# Patient Record
Sex: Female | Born: 1985 | Hispanic: Yes | Marital: Single | Smoking: Never smoker
Health system: Southern US, Community
[De-identification: ages and names within clinical notes are randomized; demographics above are authoritative.]

## PROBLEM LIST (undated history)

## (undated) ENCOUNTER — Inpatient Hospital Stay (HOSPITAL_COMMUNITY): Payer: Self-pay

## (undated) DIAGNOSIS — D649 Anemia, unspecified: Secondary | ICD-10-CM

## (undated) DIAGNOSIS — F603 Borderline personality disorder: Secondary | ICD-10-CM

## (undated) DIAGNOSIS — F32A Depression, unspecified: Secondary | ICD-10-CM

## (undated) DIAGNOSIS — F319 Bipolar disorder, unspecified: Secondary | ICD-10-CM

## (undated) DIAGNOSIS — E079 Disorder of thyroid, unspecified: Secondary | ICD-10-CM

## (undated) DIAGNOSIS — F4310 Post-traumatic stress disorder, unspecified: Secondary | ICD-10-CM

## (undated) DIAGNOSIS — M797 Fibromyalgia: Secondary | ICD-10-CM

## (undated) DIAGNOSIS — F419 Anxiety disorder, unspecified: Secondary | ICD-10-CM

## (undated) HISTORY — PX: OTHER SURGICAL HISTORY: SHX169

---

## 2019-07-28 DIAGNOSIS — R7989 Other specified abnormal findings of blood chemistry: Secondary | ICD-10-CM | POA: Insufficient documentation

## 2019-07-28 DIAGNOSIS — F419 Anxiety disorder, unspecified: Secondary | ICD-10-CM | POA: Insufficient documentation

## 2019-07-28 DIAGNOSIS — Z8639 Personal history of other endocrine, nutritional and metabolic disease: Secondary | ICD-10-CM | POA: Insufficient documentation

## 2019-07-28 DIAGNOSIS — D75839 Thrombocytosis, unspecified: Secondary | ICD-10-CM | POA: Insufficient documentation

## 2019-07-28 DIAGNOSIS — F32A Depression, unspecified: Secondary | ICD-10-CM | POA: Insufficient documentation

## 2019-07-28 DIAGNOSIS — R7303 Prediabetes: Secondary | ICD-10-CM | POA: Insufficient documentation

## 2019-07-28 DIAGNOSIS — D649 Anemia, unspecified: Secondary | ICD-10-CM | POA: Insufficient documentation

## 2019-07-28 DIAGNOSIS — R251 Tremor, unspecified: Secondary | ICD-10-CM | POA: Insufficient documentation

## 2019-07-28 DIAGNOSIS — M797 Fibromyalgia: Secondary | ICD-10-CM | POA: Insufficient documentation

## 2019-08-30 DIAGNOSIS — R29898 Other symptoms and signs involving the musculoskeletal system: Secondary | ICD-10-CM | POA: Insufficient documentation

## 2019-08-30 DIAGNOSIS — M5136 Other intervertebral disc degeneration, lumbar region: Secondary | ICD-10-CM | POA: Insufficient documentation

## 2019-08-30 DIAGNOSIS — M79604 Pain in right leg: Secondary | ICD-10-CM | POA: Insufficient documentation

## 2019-08-30 DIAGNOSIS — M545 Low back pain, unspecified: Secondary | ICD-10-CM | POA: Insufficient documentation

## 2020-01-12 DIAGNOSIS — U071 COVID-19: Secondary | ICD-10-CM

## 2020-01-12 HISTORY — DX: COVID-19: U07.1

## 2020-03-26 ENCOUNTER — Emergency Department (INDEPENDENT_AMBULATORY_CARE_PROVIDER_SITE_OTHER): Payer: BLUE CROSS/BLUE SHIELD

## 2020-03-26 ENCOUNTER — Other Ambulatory Visit: Payer: Self-pay

## 2020-03-26 ENCOUNTER — Emergency Department (INDEPENDENT_AMBULATORY_CARE_PROVIDER_SITE_OTHER)
Admission: EM | Admit: 2020-03-26 | Discharge: 2020-03-26 | Disposition: A | Discharge status: Home / Self Care | Payer: Self-pay | Source: Home / Self Care | Attending: Family Medicine | Admitting: Family Medicine

## 2020-03-26 DIAGNOSIS — R2241 Localized swelling, mass and lump, right lower limb: Secondary | ICD-10-CM | POA: Diagnosis not present

## 2020-03-26 DIAGNOSIS — T1490XA Injury, unspecified, initial encounter: Secondary | ICD-10-CM

## 2020-03-26 DIAGNOSIS — M25571 Pain in right ankle and joints of right foot: Secondary | ICD-10-CM | POA: Diagnosis not present

## 2020-03-26 DIAGNOSIS — M25561 Pain in right knee: Secondary | ICD-10-CM

## 2020-03-26 DIAGNOSIS — S8001XA Contusion of right knee, initial encounter: Secondary | ICD-10-CM

## 2020-03-26 DIAGNOSIS — S9001XA Contusion of right ankle, initial encounter: Secondary | ICD-10-CM

## 2020-03-26 MED ORDER — DICLOFENAC EPOLAMINE 1.3 % EX PTCH: 1.0000 | MEDICATED_PATCH | Freq: Two times a day (BID) | CUTANEOUS | 0 refills | Status: DC

## 2020-03-26 NOTE — ED Provider Notes (Signed)
[facility]    CSN: [id number] Arrival date & time: 03/26/20  1300      History   Chief Complaint Chief Complaint  Patient presents with  . Ankle Pain    HPI Amanda Parsons is a 35 y.o. female.   HPI   States that while at work yesterday she went to the ladies room.  Leaned on the rim of the sink and it came loose from the wall and fell to the floor.  On its way down to her thigh and ankle right leg.  These are painful and bruised.  Pain with weightbearing.  Some swelling.  She is here for evaluation.  She states that she feels that this was a hazard that was known to her employer and has already contacted an attorney  History reviewed. No pertinent past medical history.  There are no problems to display for this patient.   Past Surgical History:  Procedure Laterality Date  . sinus cavity surgery      OB History   No obstetric history on file.      Home Medications    Prior to Admission medications   Medication Sig Start Date End Date Taking? Authorizing Provider  albuterol (VENTOLIN HFA) 108 (90 Base) MCG/ACT inhaler Inhale into the lungs. 01/14/20  Yes [provider]  cetirizine (ZYRTEC) 10 MG tablet Take by mouth.   Yes [provider]  diclofenac (FLECTOR) 1.3 % PT[facility] Place 1 patch onto the skin 2 (two) times daily. 03/26/20  Yes Eustace Moore, MD  hydrOXYzine (ATARAX/VISTARIL) 25 MG tablet Take by mouth. 11/02/19  Yes [provider]  lamoTRIgine (LAMICTAL) 25 MG tablet Take by mouth. 03/24/20  Yes [provider]    Family History Family History  Problem Relation Age of Onset  . Diabetes Mother   . Hashimoto's thyroiditis Mother   . Diabetes Other     Social History Social History   Tobacco Use  . Smoking status: Never Smoker  . Smokeless tobacco: Never Used  Substance Use Topics  . Drug use: Never     Allergies   Cat hair extract, Citrullus vulgaris, Gluten meal, Ibuprofen, Latex, Nsaids,  Pineapple, Plum pulp, Pork-derived products, Salvia hispanica, Amoxicillin, Duloxetine, Levonorgestrel-ethinyl estrad, Metoclopramide, Pregabalin, and Tramadol   Review of Systems Review of Systems See HPI  Physical Exam Triage Vital Signs ED Triage Vitals  Enc Vitals Group     BP 03/26/20 1400 118/78     Pulse Rate 03/26/20 1400 (!) 101     Resp --      Temp 03/26/20 1400 98.5 F (36.9 C)     Temp src --      SpO2 03/26/20 1400 97 %     Weight 03/26/20 1350 155 lb (70.3 kg)     Height 03/26/20 1350 5\' 4"  (1.626 m)     Head Circumference --      Peak Flow --      Pain Score 03/26/20 1601 0     Pain Loc --      Pain Edu? --      Excl. in GC? --    No data found.  Updated Vital Signs BP 118/78 (BP Location: Right Arm)   Pulse (!) 101   Temp 98.5 F (36.9 C)   Ht 5\' 4"  (1.626 m)   Wt 70.3 kg   LMP 02/29/2020   SpO2 97%   BMI 26.61 kg/m     Physical Exam Constitutional:  General: She is not in acute distress.    Appearance: She is well-developed and well-nourished.     Comments: Pleasant and cooperative.  Appears uncomfortable  HENT:     Head: Normocephalic and atraumatic.     Mouth/Throat:     Mouth: Oropharynx is clear and moist.     Comments: Mask is in place Eyes:     Conjunctiva/sclera: Conjunctivae normal.     Pupils: Pupils are equal, round, and reactive to light.  Cardiovascular:     Rate and Rhythm: Normal rate.  Pulmonary:     Effort: Pulmonary effort is normal. No respiratory distress.  Abdominal:     General: There is no distension.     Palpations: Abdomen is soft.  Musculoskeletal:        General: No edema. Normal range of motion.     Cervical back: Normal range of motion.       Legs:  Skin:    General: Skin is warm and dry.  Neurological:     Mental Status: She is alert.     Gait: Gait abnormal.     Comments: Mild antalgic gait      UC Treatments / Results  Labs (all labs ordered are listed, but only abnormal results are  displayed) Labs Reviewed - No data to display  EKG   Radiology DG Ankle Complete Right  Result Date: 03/26/2020 CLINICAL DATA:  Pain EXAM: RIGHT ANKLE - COMPLETE 3+ VIEW COMPARISON:  None. FINDINGS: There are soft tissue swelling about the medial aspect of the ankle and foot. There is no definite acute displaced fracture or dislocation. There appears to be a well corticated osseous fragment adjacent to the medial calcaneus, perhaps related to an old remote injury. IMPRESSION: Soft tissue swelling without evidence for an acute displaced fracture. Electronically Signed   By: Katherine Mantle M.D.   On: 03/26/2020 15:25   DG Knee AP/LAT W/Sunrise Right  Result Date: 03/26/2020 CLINICAL DATA:  Pain EXAM: RIGHT KNEE 3 VIEWS COMPARISON:  None. FINDINGS: No evidence of fracture, dislocation, or joint effusion. No evidence of arthropathy or other focal bone abnormality. Soft tissues are unremarkable. IMPRESSION: Negative. Electronically Signed   By: Katherine Mantle M.D.   On: 03/26/2020 15:23    Procedures Procedures (including critical care time)  Medications Ordered in UC Medications - No data to display  Initial Impression / Assessment and Plan / UC Course  I have reviewed the triage vital signs and the nursing notes.  Pertinent labs & imaging results that were available during my care of the patient were reviewed by me and considered in my medical decision making (see chart for details).     Conservative management.Follow-up Worker's Comp. provider Final Clinical Impressions(s) / UC Diagnoses   Final diagnoses:  Contusion of right knee, initial encounter  Contusion of right ankle, initial encounter     Discharge Instructions     Contusion right knee Contusion right ankle Limit walking while  leg is painful Use ice for 20 minutes every couple of hours With Worker's Comp. clinic    ED Prescriptions    Medication Sig Dispense Auth. Provider   diclofenac (FLECTOR) 1.3 %  PT[facility] Place 1 patch onto the skin 2 (two) times daily. 60 patch Eustace Moore, MD     PDMP not reviewed this encounter.   Eustace Moore, MD 03/26/20 [id number]

## 2020-03-26 NOTE — ED Triage Notes (Signed)
Pt states that she injured her right leg.x1 day

## 2020-03-26 NOTE — Discharge Instructions (Addendum)
Contusion right knee Contusion right ankle Limit walking while  leg is painful Use ice for 20 minutes every couple of hours With [employer]. clinic

## 2020-03-27 ENCOUNTER — Telehealth: Payer: Self-pay | Admitting: Emergency Medicine

## 2020-03-27 NOTE — Telephone Encounter (Signed)
Call to pharmacy regarding alternative med request - diclofenac epolamine 1.3% patch. Per pharmacist, insurance will not cover med because it can be purchased over the counter and there is not an alternative.

## 2020-03-27 NOTE — Telephone Encounter (Signed)
Call back to patient regarding prescription. Pt has already spoken with the insurance company regarding the medication and is aware that it is not covered and there are no alternatives. Pt states she is unable to afford the medicine over the counter due to limited funds already for food and gas.Patient apologized for her short tone on the phone.  RN stated she understood the patient's frustration with not being able to obtain the medicine and did not notice anything negative in the patient's tone. RN wished patient well.

## 2020-03-29 NOTE — Telephone Encounter (Signed)
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

## 2020-04-15 ENCOUNTER — Other Ambulatory Visit: Payer: Self-pay

## 2020-04-15 ENCOUNTER — Emergency Department (INDEPENDENT_AMBULATORY_CARE_PROVIDER_SITE_OTHER)
Admission: RE | Admit: 2020-04-15 | Discharge: 2020-04-15 | Disposition: A | Discharge status: Home / Self Care | Payer: BLUE CROSS/BLUE SHIELD | Source: Ambulatory Visit

## 2020-04-15 VITALS — BP 113/80 | HR 88 | Temp 99.5°F | Resp 17

## 2020-04-15 DIAGNOSIS — R5383 Other fatigue: Secondary | ICD-10-CM

## 2020-04-15 DIAGNOSIS — N939 Abnormal uterine and vaginal bleeding, unspecified: Secondary | ICD-10-CM | POA: Diagnosis not present

## 2020-04-15 DIAGNOSIS — R55 Syncope and collapse: Secondary | ICD-10-CM

## 2020-04-15 DIAGNOSIS — R1030 Lower abdominal pain, unspecified: Secondary | ICD-10-CM

## 2020-04-15 LAB — POCT URINALYSIS DIP (MANUAL ENTRY)
Bilirubin, UA: NEGATIVE
Glucose, UA: NEGATIVE mg/dL
Ketones, POC UA: NEGATIVE mg/dL
Leukocytes, UA: NEGATIVE
Nitrite, UA: NEGATIVE
Spec Grav, UA: 1.025 (ref 1.010–1.025)
Urobilinogen, UA: 0.2 E.U./dL
pH, UA: 7 (ref 5.0–8.0)

## 2020-04-15 LAB — POCT URINE PREGNANCY: Preg Test, Ur: NEGATIVE

## 2020-04-15 NOTE — ED Triage Notes (Signed)
Abdominal cramping & bleeding after fainting 2 days ago  Home pregnancy test negative 1 week ago  COVID in November  UTI in Jan 2022 No COVID vaccine

## 2020-04-15 NOTE — ED Provider Notes (Signed)
[facility]    CSN: [id number] Arrival date & time: 04/15/20  1445      History   Chief Complaint Chief Complaint  Patient presents with  . Abdominal Cramping  . Vaginal Bleeding    HPI Amanda Parsons is a 35 y.o. female.   Reports that she has been having abnormal abdominal cramping and vaginal bleeding than usual intermittently for the last 2 weeks. Reports increased fatigue and abdominal pain. Reports negative urine pregnancy test at home last week. Reports that she also passed out x 2 days ago after an episode of panic and increased stress. She has history of anxiety, depression, anemia, elevated LFTs, thyroid disorder. Reports hx of borderline personality disorder as well. Denies headache, nausea, vomiting, diarrhea, changes in appetite, rash, fever, other symptoms.  ROS per HPI  The history is provided by the patient.    History reviewed. No pertinent past medical history.  Patient Active Problem List   Diagnosis Date Noted  . DDD (degenerative disc disease), lumbar 08/30/2019  . Leg pain, bilateral 08/30/2019  . Leg weakness 08/30/2019  . Low back pain 08/30/2019  . Anxiety and depression 07/28/2019  . Anemia 07/28/2019  . Elevated LFTs 07/28/2019  . Fibromyalgia 07/28/2019  . History of thyroid disorder 07/28/2019  . Prediabetes 07/28/2019  . Tremors of nervous system 07/28/2019    Past Surgical History:  Procedure Laterality Date  . sinus cavity surgery      OB History   No obstetric history on file.      Home Medications    Prior to Admission medications   Medication Sig Start Date End Date Taking? Authorizing Provider  cetirizine (ZYRTEC) 10 MG tablet Take by mouth.   Yes [provider]  hydrOXYzine (ATARAX/VISTARIL) 25 MG tablet Take by mouth. 11/02/19  Yes [provider]  lamoTRIgine (LAMICTAL) 25 MG tablet Take by mouth. 03/24/20  Yes [provider]  omeprazole (PRILOSEC) 20 MG capsule Take 1 capsule  by mouth daily. 03/27/20  Yes [provider]  albuterol (VENTOLIN HFA) 108 (90 Base) MCG/ACT inhaler Inhale into the lungs. 01/14/20   [provider]  diclofenac (FLECTOR) 1.3 % PT[facility] Place 1 patch onto the skin 2 (two) times daily. Patient not taking: Reported on 04/15/2020 03/26/20   Eustace Moore, MD    Family History Family History  Problem Relation Age of Onset  . Diabetes Mother   . Hashimoto's thyroiditis Mother   . Diabetes Other     Social History Social History   Tobacco Use  . Smoking status: Never Smoker  . Smokeless tobacco: Never Used  Substance Use Topics  . Alcohol use: Not Currently  . Drug use: Never     Allergies   Cat hair extract, Citrullus vulgaris, Gluten meal, Ibuprofen, Latex, Nsaids, Pineapple, Plum pulp, Pork-derived products, Salvia hispanica, Amoxicillin, Duloxetine, Levonorgestrel-ethinyl estrad, Metoclopramide, Pregabalin, and Tramadol   Review of Systems Review of Systems   Physical Exam Triage Vital Signs ED Triage Vitals  Enc Vitals Group     BP 04/15/20 1509 113/80     Pulse Rate 04/15/20 1509 88     Resp 04/15/20 1509 17     Temp 04/15/20 1509 99.5 F (37.5 C)     Temp Source 04/15/20 1509 Oral     SpO2 04/15/20 1509 98 %     Weight --      Height --      Head Circumference --      Peak Flow --  Pain Score 04/15/20 1510 4     Pain Loc --      Pain Edu? --      Excl. in GC? --    No data found.  Updated Vital Signs BP 113/80 (BP Location: Right Arm)   Pulse 88   Temp 99.5 F (37.5 C) (Oral)   Resp 17   LMP 03/29/2020 (Exact Date)   SpO2 98%   Visual Acuity Right Eye Distance:   Left Eye Distance:   Bilateral Distance:    Right Eye Near:   Left Eye Near:    Bilateral Near:     Physical Exam Vitals and nursing note reviewed.  Constitutional:      General: She is not in acute distress.    Appearance: Normal appearance. She is well-developed and well-nourished. She is not ill-appearing.   HENT:     Head: Normocephalic and atraumatic.  Eyes:     Conjunctiva/sclera: Conjunctivae normal.  Cardiovascular:     Rate and Rhythm: Normal rate and regular rhythm.     Heart sounds: Normal heart sounds. No murmur heard.   Pulmonary:     Effort: Pulmonary effort is normal. No respiratory distress.     Breath sounds: Normal breath sounds. No stridor. No wheezing, rhonchi or rales.  Chest:     Chest wall: No tenderness.  Abdominal:     General: Bowel sounds are normal. There is no distension.     Palpations: Abdomen is soft. There is no mass.     Tenderness: There is no abdominal tenderness. There is no right CVA tenderness, left CVA tenderness, guarding or rebound.     Hernia: No hernia is present.  Musculoskeletal:        General: No edema. Normal range of motion.     Cervical back: Normal range of motion and neck supple.  Skin:    General: Skin is warm and dry.     Capillary Refill: Capillary refill takes less than 2 seconds.  Neurological:     General: No focal deficit present.     Mental Status: She is alert and oriented to person, place, and time.  Psychiatric:        Mood and Affect: Mood and affect and mood normal.        Behavior: Behavior normal.        Thought Content: Thought content normal.      UC Treatments / Results  Labs (all labs ordered are listed, but only abnormal results are displayed) Labs Reviewed  POCT URINALYSIS DIP (MANUAL ENTRY) - Abnormal; Notable for the following components:      Result Value   Blood, UA large (*)    Protein Ur, POC trace (*)    All other components within normal limits  URINE CULTURE  BASIC METABOLIC PANEL  CBC WITH DIFFERENTIAL/PLATELET  HCG, QUANTITATIVE, PREGNANCY  POCT URINE PREGNANCY    EKG   Radiology No results found.  Procedures Procedures (including critical care time)  Medications Ordered in UC Medications - No data to display  Initial Impression / Assessment and Plan / UC Course  I have  reviewed the triage vital signs and the nursing notes.  Pertinent labs & imaging results that were available during my care of the patient were reviewed by me and considered in my medical decision making (see chart for details).    Fatigue Low Abdominal Pain Abnormal Vaginal Bleeding Syncope  UA unremarkable for infection Urine pregnancy test is negative in office today Will  check CBC, BMP, beta HCG Discussed supplementing iron given hx anemia, abnormal bleeding and fatigue Will follow up with abnormal results that require further treatment Follow up with this office or with primary care if symptoms are persisting.  Follow up in the ER for high fever, trouble swallowing, trouble breathing, other concerning symptoms.  Final Clinical Impressions(s) / UC Diagnoses   Final diagnoses:  Other fatigue  Lower abdominal pain  Abnormal vaginal bleeding  Syncope, unspecified syncope type     Discharge Instructions     Continue current medication regimen at home  Urine looks good today. We will culture to be sure that there is no infection and inform of positive results that require further treatment  Blood work is pending. We will inform you of any abnormal results that require further treatment  Push fluids and get plenty of rest  Follow up with this office or with primary care if symptoms are persisting.  Follow up in the ER for high fever, trouble swallowing, trouble breathing, other concerning symptoms.     ED Prescriptions    None     PDMP not reviewed this encounter.   Moshe Cipro, NP 04/16/20 [id number]

## 2020-04-15 NOTE — Discharge Instructions (Signed)
Continue current medication regimen at home  Urine looks good today. We will culture to be sure that there is no infection and inform of positive results that require further treatment  Blood work is pending. We will inform you of any abnormal results that require further treatment  Push fluids and get plenty of rest  Follow up with this office or with primary care if symptoms are persisting.  Follow up in the ER for high fever, trouble swallowing, trouble breathing, other concerning symptoms.

## 2020-04-16 LAB — BASIC METABOLIC PANEL
BUN: 15 mg/dL (ref 7–25)
CO2: 25 mmol/L (ref 20–32)
Calcium: 9.3 mg/dL (ref 8.6–10.2)
Chloride: 107 mmol/L (ref 98–110)
Creat: 0.57 mg/dL (ref 0.50–1.10)
Glucose, Bld: 97 mg/dL (ref 65–99)
Potassium: 4.1 mmol/L (ref 3.5–5.3)
Sodium: 139 mmol/L (ref 135–146)

## 2020-04-16 LAB — URINE CULTURE
MICRO NUMBER:: 11613581
SPECIMEN QUALITY:: ADEQUATE

## 2020-04-16 LAB — CBC WITH DIFFERENTIAL/PLATELET
Absolute Monocytes: 572 cells/uL (ref 200–950)
Basophils Absolute: 62 cells/uL (ref 0–200)
Basophils Relative: 0.7 %
Eosinophils Absolute: 88 cells/uL (ref 15–500)
Eosinophils Relative: 1 %
HCT: 40.3 % (ref 35.0–45.0)
Hemoglobin: 13.2 g/dL (ref 11.7–15.5)
Lymphs Abs: 1558 cells/uL (ref 850–3900)
MCH: 27.4 pg (ref 27.0–33.0)
MCHC: 32.8 g/dL (ref 32.0–36.0)
MCV: 83.8 fL (ref 80.0–100.0)
MPV: 10.3 fL (ref 7.5–12.5)
Monocytes Relative: 6.5 %
Neutro Abs: 6521 cells/uL (ref 1500–7800)
Neutrophils Relative %: 74.1 %
Platelets: 473 10*3/uL — ABNORMAL HIGH (ref 140–400)
RBC: 4.81 10*6/uL (ref 3.80–5.10)
RDW: 13 % (ref 11.0–15.0)
Total Lymphocyte: 17.7 %
WBC: 8.8 10*3/uL (ref 3.8–10.8)

## 2020-04-16 LAB — HCG, QUANTITATIVE, PREGNANCY: HCG, Total, QN: <3 m[IU]/mL

## 2020-04-27 ENCOUNTER — Ambulatory Visit: Payer: Self-pay

## 2020-04-27 ENCOUNTER — Other Ambulatory Visit: Payer: Self-pay

## 2020-04-27 ENCOUNTER — Emergency Department
Admission: RE | Admit: 2020-04-27 | Discharge: 2020-04-27 | Disposition: A | Discharge status: Home / Self Care | Payer: BLUE CROSS/BLUE SHIELD | Source: Ambulatory Visit | Attending: Family Medicine | Admitting: Family Medicine

## 2020-04-27 VITALS — BP 118/83 | HR 92 | Temp 98.4°F | Resp 18

## 2020-04-27 DIAGNOSIS — R7989 Other specified abnormal findings of blood chemistry: Secondary | ICD-10-CM

## 2020-04-27 DIAGNOSIS — F603 Borderline personality disorder: Secondary | ICD-10-CM | POA: Insufficient documentation

## 2020-04-27 NOTE — Discharge Instructions (Signed)
Your liver enzymes and pancreatitis tests have not been repeated since January Last time you had diarrhea you did have pancreatitis I have repeated these blood test today Continue your bland diet.  Drink plenty of fluids Consider Imodium AD, 1 tablet daily for loose bowels and diarrhea See your primary care doctor next week

## 2020-04-27 NOTE — ED Triage Notes (Signed)
Abdominal cramping w/ diarrhea x 2 days  chronic fatigue  Limited PO intake

## 2020-04-27 NOTE — ED Notes (Signed)
Labs not drawn due to poor IV access- pt does not want to return in am for labs- has a PCP appointment on 05/02/20 & will have labs drawn at that time. Dr Delton See verbally updated by RN

## 2020-04-27 NOTE — ED Provider Notes (Signed)
[facility]    CSN: [id number] Arrival date & time: 04/27/20  1857      History   Chief Complaint Chief Complaint  Patient presents with  . Diarrhea    HPI Amanda Parsons is a 35 y.o. female.   HPI  Patient has a long history of abdominal problems.  Multiple food allergies.  She takes her a probiotic daily.  She was seen in the emergency room for diarrhea in January of this year.  Her lipase was 145, she was having diarrhea, she was told she might have some pancreatitis.  Also has a history of increased liver function tests.  She has a primary care doctor who takes care of her. Patient just finished a course of Keflex for UTI.  States has had recurring UTIs.  This is one of the issues to discuss with her PCP.  I recommended that she repeat a UA today to make sure her  UTI has resolved. Developed "diarrhea" after she finished the antibiotic for her UTI.  She states she is having 1 or 2 bowel movements a day, but they are looser.  1 for bowel movements is fatty.  She thinks that her pancreatitis is coming back.  She does not drink alcohol.  She is also having some crampy abdominal pain.  History reviewed. No pertinent past medical history.  Patient Active Problem List   Diagnosis Date Noted  . Borderline personality disorder (HCC) 04/27/2020  . DDD (degenerative disc disease), lumbar 08/30/2019  . Anxiety and depression 07/28/2019  . Anemia 07/28/2019  . Elevated LFTs 07/28/2019  . Fibromyalgia 07/28/2019  . History of thyroid disorder 07/28/2019  . Prediabetes 07/28/2019  . Tremors of nervous system 07/28/2019    Past Surgical History:  Procedure Laterality Date  . sinus cavity surgery      OB History   No obstetric history on file.      Home Medications    Prior to Admission medications   Medication Sig Start Date End Date Taking? Authorizing Provider  cetirizine (ZYRTEC) 10 MG tablet Take by mouth.   Yes [provider]  hydrOXYzine  (ATARAX/VISTARIL) 25 MG tablet Take by mouth. 11/02/19  Yes [provider]  lamoTRIgine (LAMICTAL) 25 MG tablet Take by mouth. 03/24/20  Yes [provider]  omeprazole (PRILOSEC) 20 MG capsule Take 1 capsule by mouth daily. 03/27/20  Yes [provider]  Probiotic Product (MISC INTESTINAL FLORA REGULAT) CAPS Take 1 tablet by mouth daily.   Yes [provider]  Turmeric 500 MG CAPS Take by mouth.   Yes [provider]  albuterol (VENTOLIN HFA) 108 (90 Base) MCG/ACT inhaler Inhale into the lungs. 01/14/20   [provider]    Family History Family History  Problem Relation Age of Onset  . Diabetes Mother   . Hashimoto's thyroiditis Mother   . Diabetes Other     Social History Social History   Tobacco Use  . Smoking status: Never Smoker  . Smokeless tobacco: Never Used  Substance Use Topics  . Alcohol use: Not Currently  . Drug use: Never     Allergies   Citrullus vulgaris, Gluten meal, Ibuprofen, Latex, Nsaids, Pineapple, Plum pulp, Pork-derived products, Salvia hispanica, Amoxicillin, Cat hair extract, Duloxetine, Levonorgestrel-ethinyl estrad, Metoclopramide, Pregabalin, and Tramadol   Review of Systems Review of Systems See HPI  Physical Exam Triage Vital Signs ED Triage Vitals  Enc Vitals Group     BP 04/27/20 1921 118/83     Pulse  Rate 04/27/20 1921 92     Resp 04/27/20 1921 18     Temp 04/27/20 1921 98.4 F (36.9 C)     Temp Source 04/27/20 1921 Oral     SpO2 04/27/20 1921 100 %     Weight --      Height --      Head Circumference --      Peak Flow --      Pain Score 04/27/20 1945 2     Pain Loc --      Pain Edu? --      Excl. in GC? --    No data found.  Updated Vital Signs BP 118/83 (BP Location: Right Arm)   Pulse 92   Temp 98.4 F (36.9 C) (Oral)   Resp 18   LMP 04/14/2020 (Exact Date)   SpO2 100%      Physical Exam Constitutional:      General: She is not in acute distress.     Appearance: She is well-developed.  HENT:     Head: Normocephalic and atraumatic.     Right Ear: Tympanic membrane, ear canal and external ear normal.     Left Ear: Tympanic membrane, ear canal and external ear normal.     Nose: Nose normal.     Mouth/Throat:     Mouth: Mucous membranes are moist.     Pharynx: No posterior oropharyngeal erythema.  Eyes:     Conjunctiva/sclera: Conjunctivae normal.     Pupils: Pupils are equal, round, and reactive to light.  Cardiovascular:     Rate and Rhythm: Normal rate and regular rhythm.     Heart sounds: Normal heart sounds.  Pulmonary:     Effort: Pulmonary effort is normal. No respiratory distress.  Chest:     Chest wall: No tenderness.  Abdominal:     General: There is no distension.     Palpations: Abdomen is soft.     Tenderness: There is abdominal tenderness. There is no guarding or rebound.     Comments: Moderate tenderness in right lower quadrant without guarding or rebound.  No hepatosplenomegaly.  Musculoskeletal:        General: Normal range of motion.     Cervical back: Normal range of motion.  Skin:    General: Skin is warm and dry.  Neurological:     Mental Status: She is alert.  Psychiatric:        Behavior: Behavior normal.      UC Treatments / Results  Labs (all labs ordered are listed, but only abnormal results are displayed) Labs Reviewed  HEPATIC FUNCTION PANEL  LIPASE    EKG   Radiology No results found.  Procedures Procedures (including critical care time)  Medications Ordered in UC Medications - No data to display  Initial Impression / Assessment and Plan / UC Course  I have reviewed the triage vital signs and the nursing notes.  Pertinent labs & imaging results that were available during my care of the patient were reviewed by me and considered in my medical decision making (see chart for details).     I explained to the patient that 1 loose bowel movement did not constitute diarrhea.  The  fact that she was having loose floating bowel movements though could indicate pancreatic dysfunction.  I offered to do blood work to repeat her hepatic function and lipase since it had not been done since January.  Unfortunately, my nurse was unable to get blood on her.  She states she will have the blood drawn at her family medicine office. Final Clinical Impressions(s) / UC Diagnoses   Final diagnoses:  Elevated LFTs     Discharge Instructions     Your liver enzymes and pancreatitis tests have not been repeated since January Last time you had diarrhea you did have pancreatitis I have repeated these blood test today Continue your bland diet.  Drink plenty of fluids Consider Imodium AD, 1 tablet daily for loose bowels and diarrhea See your primary care doctor next week   ED Prescriptions    None     PDMP not reviewed this encounter.   Eustace Moore, MD 04/27/20 2040

## 2020-06-19 ENCOUNTER — Ambulatory Visit: Payer: Self-pay

## 2020-10-16 ENCOUNTER — Emergency Department (INDEPENDENT_AMBULATORY_CARE_PROVIDER_SITE_OTHER)
Admission: RE | Admit: 2020-10-16 | Discharge: 2020-10-16 | Disposition: A | Discharge status: Home / Self Care | Payer: BLUE CROSS/BLUE SHIELD | Source: Ambulatory Visit

## 2020-10-16 ENCOUNTER — Other Ambulatory Visit: Payer: Self-pay

## 2020-10-16 VITALS — BP 111/76 | HR 98 | Temp 99.0°F | Resp 18 | Ht 64.0 in | Wt 165.0 lb

## 2020-10-16 DIAGNOSIS — R42 Dizziness and giddiness: Secondary | ICD-10-CM

## 2020-10-16 LAB — POCT URINALYSIS DIP (MANUAL ENTRY)
Bilirubin, UA: NEGATIVE
Glucose, UA: NEGATIVE mg/dL
Ketones, POC UA: NEGATIVE mg/dL
Leukocytes, UA: NEGATIVE
Nitrite, UA: NEGATIVE
Protein Ur, POC: NEGATIVE mg/dL
Spec Grav, UA: >=1.03 — AB (ref 1.010–1.025)
Urobilinogen, UA: 0.2 E.U./dL
pH, UA: 6 (ref 5.0–8.0)

## 2020-10-16 MED ORDER — MECLIZINE HCL 25 MG PO TABS: 25.0000 mg | ORAL_TABLET | Freq: Three times a day (TID) | ORAL | 0 refills | Status: AC

## 2020-10-16 NOTE — ED Triage Notes (Signed)
Felt dizzy - comes and goes since having COVID (9 months ago) Leg pain x 2 weeks  Pt states she will need a work note  Talked to pt about seeing her PCP for [facility]

## 2020-10-16 NOTE — Discharge Instructions (Addendum)
Advised/instructed patient to take medication as directed.  Advised patient if symptoms worsen and/or unresolved please follow-up with PCP for further evaluation and possible serological testing.  Work note provided per patient request.

## 2020-10-16 NOTE — ED Provider Notes (Signed)
[facility]    CSN: [id number] Arrival date & time: 10/16/20  1753      History   Chief Complaint Chief Complaint  Patient presents with   Dizziness    HPI Amanda Parsons is a 35 y.o. female.   HPI 35 year old female presents with dizziness on and off for 9 months.  Patient reports will need a work note.  Nursing staff has discussed with patient about seeing PCP for FMLA.  Past Medical History:  Diagnosis Date   COVID 01/2020    Patient Active Problem List   Diagnosis Date Noted   Borderline personality disorder (HCC) 04/27/2020   DDD (degenerative disc disease), lumbar 08/30/2019   Anxiety and depression 07/28/2019   Anemia 07/28/2019   Elevated LFTs 07/28/2019   Fibromyalgia 07/28/2019   History of thyroid disorder 07/28/2019   Prediabetes 07/28/2019   Tremors of nervous system 07/28/2019    Past Surgical History:  Procedure Laterality Date   sinus cavity surgery      OB History   No obstetric history on file.      Home Medications    Prior to Admission medications   Medication Sig Start Date End Date Taking? Authorizing Provider  meclizine (ANTIVERT) 25 MG tablet Take 1 tablet (25 mg total) by mouth 3 (three) times daily for 7 days. 10/16/20 10/23/20 Yes Trevor Iha, FNP  norethindrone (MICRONOR) 0.35 MG tablet Take 1 tablet by mouth daily. 08/29/20  Yes [provider]  traZODone (DESYREL) 50 MG tablet Take by mouth. 09/29/20  Yes [provider]  albuterol (VENTOLIN HFA) 108 (90 Base) MCG/ACT inhaler Inhale into the lungs. Patient not taking: Reported on 10/16/2020 01/14/20   [provider]  cetirizine (ZYRTEC) 10 MG tablet Take by mouth.    [provider]  FLUoxetine (PROZAC) 10 MG tablet Take 10 mg by mouth daily. 10/12/20   [provider]  hydrOXYzine (ATARAX/VISTARIL) 25 MG tablet Take by mouth. Patient not taking: Reported on 10/16/2020 11/02/19   [provider]  lamoTRIgine  (LAMICTAL) 25 MG tablet Take by mouth. Patient not taking: Reported on 10/16/2020 03/24/20   [provider]  omeprazole (PRILOSEC) 20 MG capsule Take 1 capsule by mouth daily. 03/27/20   [provider]  Probiotic Product (MISC INTESTINAL FLORA REGULAT) CAPS Take 1 tablet by mouth daily.    [provider]  Tart Cherry 1200 MG CAPS Take 1 capsule by mouth 2 (two) times a week.    [provider]  Turmeric 500 MG CAPS Take by mouth.    [provider]    Family History Family History  Problem Relation Age of Onset   Diabetes Mother    Hashimoto's thyroiditis Mother    Diabetes Other     Social History Social History   Tobacco Use   Smoking status: Never   Smokeless tobacco: Never  Vaping Use   Vaping Use: Never used  Substance Use Topics   Alcohol use: Not Currently   Drug use: Never     Allergies   Citrullus vulgaris, Gluten meal, Ibuprofen, Latex, Nsaids, Pineapple, Plum pulp, Pork-derived products, Salvia hispanica, Amoxicillin, Cat hair extract, Duloxetine, Levonorgestrel-ethinyl estrad, Metoclopramide, Pregabalin, and Tramadol   Review of Systems Review of Systems  Neurological:  Positive for dizziness.    Physical Exam Triage Vital Signs ED Triage Vitals  Enc Vitals Group     BP 10/16/20 1821 111/76     Pulse Rate 10/16/20 1821 98     Resp  10/16/20 1821 18     Temp 10/16/20 1821 99 F (37.2 C)     Temp Source 10/16/20 1821 Oral     SpO2 10/16/20 1821 98 %     Weight 10/16/20 1825 165 lb (74.8 kg)     Height 10/16/20 1825 5\' 4"  (1.626 m)     Head Circumference --      Peak Flow --      Pain Score 10/16/20 1825 0     Pain Loc --      Pain Edu? --      Excl. in GC? --    No data found.  Updated Vital Signs BP 111/76 (BP Location: Right Arm)   Pulse 98   Temp 99 F (37.2 C) (Oral)   Resp 18   Ht 5\' 4"  (1.626 m)   Wt 165 lb (74.8 kg)   LMP 10/16/2020   SpO2 98%   BMI 28.32 kg/m     Physical  Exam Vitals and nursing note reviewed.  Constitutional:      General: She is not in acute distress.    Appearance: Normal appearance. She is obese. She is not ill-appearing.  HENT:     Head: Normocephalic and atraumatic.     Right Ear: Tympanic membrane, ear canal and external ear normal.     Left Ear: Tympanic membrane, ear canal and external ear normal.     Mouth/Throat:     Mouth: Mucous membranes are moist.     Pharynx: Oropharynx is clear.  Eyes:     Extraocular Movements: Extraocular movements intact.     Conjunctiva/sclera: Conjunctivae normal.     Pupils: Pupils are equal, round, and reactive to light.  Cardiovascular:     Rate and Rhythm: Normal rate and regular rhythm.     Pulses: Normal pulses.     Heart sounds: Normal heart sounds. No murmur heard. Pulmonary:     Effort: Pulmonary effort is normal.     Breath sounds: Rhonchi present. No wheezing or rales.  Musculoskeletal:        General: Normal range of motion.     Cervical back: Normal range of motion and neck supple. No tenderness.  Lymphadenopathy:     Cervical: No cervical adenopathy.  Skin:    General: Skin is warm and dry.  Neurological:     General: No focal deficit present.     Mental Status: She is alert and oriented to person, place, and time. Mental status is at baseline.  Psychiatric:        Mood and Affect: Mood normal.        Behavior: Behavior normal.        Thought Content: Thought content normal.     UC Treatments / Results  Labs (all labs ordered are listed, but only abnormal results are displayed) Labs Reviewed  POCT URINALYSIS DIP (MANUAL ENTRY) - Abnormal; Notable for the following components:      Result Value   Spec Grav, UA >=1.030 (*)    Blood, UA moderate (*)    All other components within normal limits    EKG   Radiology No results found.  Procedures Procedures (including critical care time)  Medications Ordered in UC Medications - No data to display  Initial  Impression / Assessment and Plan / UC Course  I have reviewed the triage vital signs and the nursing notes.  Pertinent labs & imaging results that were available during my care of the patient were reviewed by  me and considered in my medical decision making (see chart for details).     MDM: 1.  Dizziness-intermittent ongoing for 9 months.  Encouraged patient to follow-up with her PCP for further evaluation and possible serological testing; 2.  Vertigo-Rx'd Meclizine. Advised/instructed patient to take medication as directed.  Advised patient if symptoms worsen and/or unresolved please follow-up with PCP for further evaluation and possible serological testing.  Work note provided per patient request.  Patient discharged home, hemodynamically stable. Final diagnoses:  Dizziness  Vertigo     Discharge Instructions      Advised/instructed patient to take medication as directed.  Advised patient if symptoms worsen and/or unresolved please follow-up with PCP for further evaluation and possible serological testing.  Work note provided per patient request.     ED Prescriptions     Medication Sig Dispense Auth. Provider   meclizine (ANTIVERT) 25 MG tablet Take 1 tablet (25 mg total) by mouth 3 (three) times daily for 7 days. 21 tablet Trevor Iha, FNP      PDMP not reviewed this encounter.   Trevor Iha, FNP 10/16/20 1912

## 2020-12-08 DIAGNOSIS — F603 Borderline personality disorder: Secondary | ICD-10-CM | POA: Diagnosis not present

## 2020-12-08 DIAGNOSIS — F1020 Alcohol dependence, uncomplicated: Secondary | ICD-10-CM | POA: Diagnosis not present

## 2020-12-08 DIAGNOSIS — F331 Major depressive disorder, recurrent, moderate: Secondary | ICD-10-CM | POA: Diagnosis not present

## 2020-12-08 DIAGNOSIS — F4312 Post-traumatic stress disorder, chronic: Secondary | ICD-10-CM | POA: Diagnosis not present

## 2021-12-06 DIAGNOSIS — R45851 Suicidal ideations: Secondary | ICD-10-CM | POA: Insufficient documentation

## 2021-12-06 DIAGNOSIS — F4321 Adjustment disorder with depressed mood: Secondary | ICD-10-CM | POA: Insufficient documentation

## 2021-12-06 DIAGNOSIS — F39 Unspecified mood [affective] disorder: Secondary | ICD-10-CM | POA: Insufficient documentation

## 2021-12-27 DIAGNOSIS — J3089 Other allergic rhinitis: Secondary | ICD-10-CM | POA: Insufficient documentation

## 2022-03-19 DIAGNOSIS — F439 Reaction to severe stress, unspecified: Secondary | ICD-10-CM | POA: Diagnosis not present

## 2022-03-19 DIAGNOSIS — F332 Major depressive disorder, recurrent severe without psychotic features: Secondary | ICD-10-CM | POA: Diagnosis not present

## 2022-03-19 DIAGNOSIS — R251 Tremor, unspecified: Secondary | ICD-10-CM | POA: Diagnosis not present

## 2022-03-19 DIAGNOSIS — F603 Borderline personality disorder: Secondary | ICD-10-CM | POA: Diagnosis not present

## 2022-03-19 DIAGNOSIS — F319 Bipolar disorder, unspecified: Secondary | ICD-10-CM | POA: Diagnosis not present

## 2022-03-19 DIAGNOSIS — N920 Excessive and frequent menstruation with regular cycle: Secondary | ICD-10-CM | POA: Diagnosis not present

## 2022-03-21 DIAGNOSIS — R1084 Generalized abdominal pain: Secondary | ICD-10-CM | POA: Diagnosis not present

## 2022-03-21 DIAGNOSIS — R0602 Shortness of breath: Secondary | ICD-10-CM | POA: Diagnosis not present

## 2022-03-21 DIAGNOSIS — Z1152 Encounter for screening for COVID-19: Secondary | ICD-10-CM | POA: Diagnosis not present

## 2022-03-21 DIAGNOSIS — Z20822 Contact with and (suspected) exposure to covid-19: Secondary | ICD-10-CM | POA: Diagnosis not present

## 2022-03-21 DIAGNOSIS — R112 Nausea with vomiting, unspecified: Secondary | ICD-10-CM | POA: Diagnosis not present

## 2022-03-21 DIAGNOSIS — K5900 Constipation, unspecified: Secondary | ICD-10-CM | POA: Diagnosis not present

## 2022-03-21 DIAGNOSIS — R0789 Other chest pain: Secondary | ICD-10-CM | POA: Diagnosis not present

## 2022-03-29 IMAGING — DX DG ANKLE COMPLETE 3+V*R*
3 series · 3 of 3 positions shown · non-contrast
Comparison: None.

CLINICAL DATA: Pain

EXAM:
RIGHT ANKLE - COMPLETE 3+ VIEW

[ankle ap]
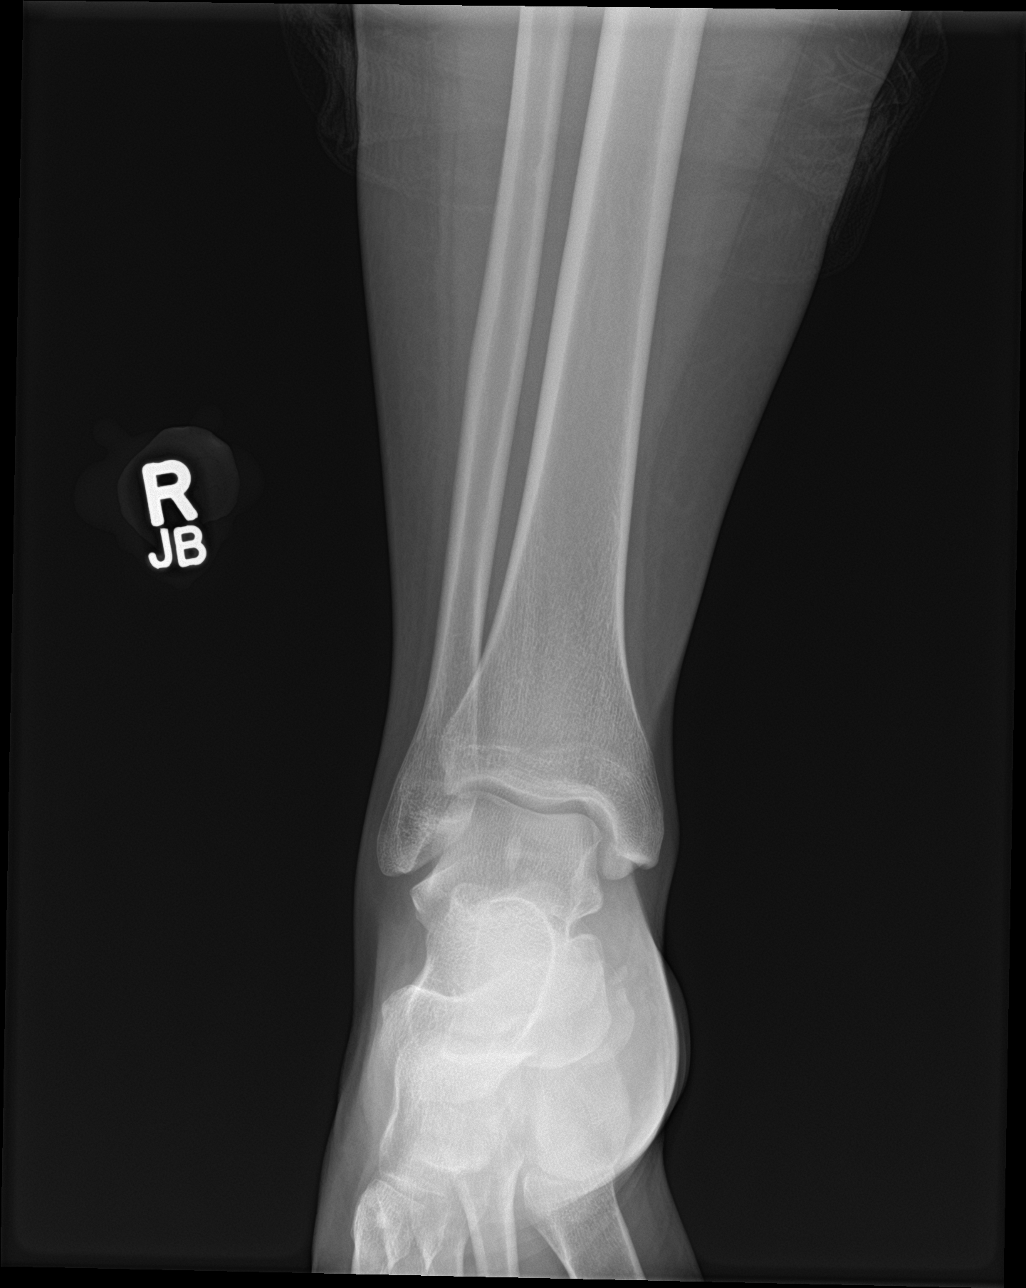

[ankle obl]
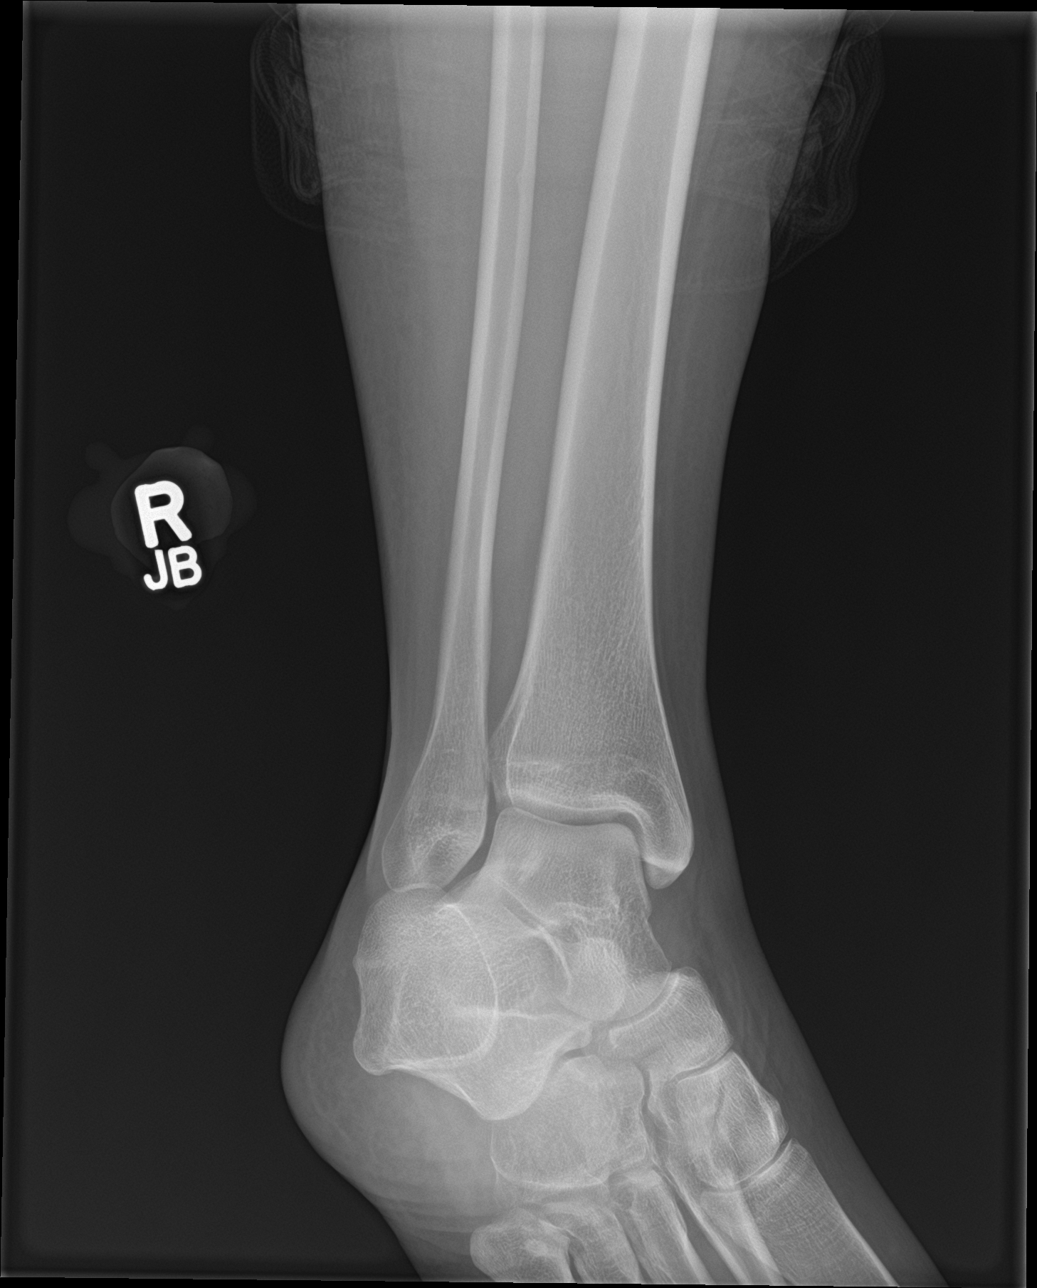

[ankle lat]
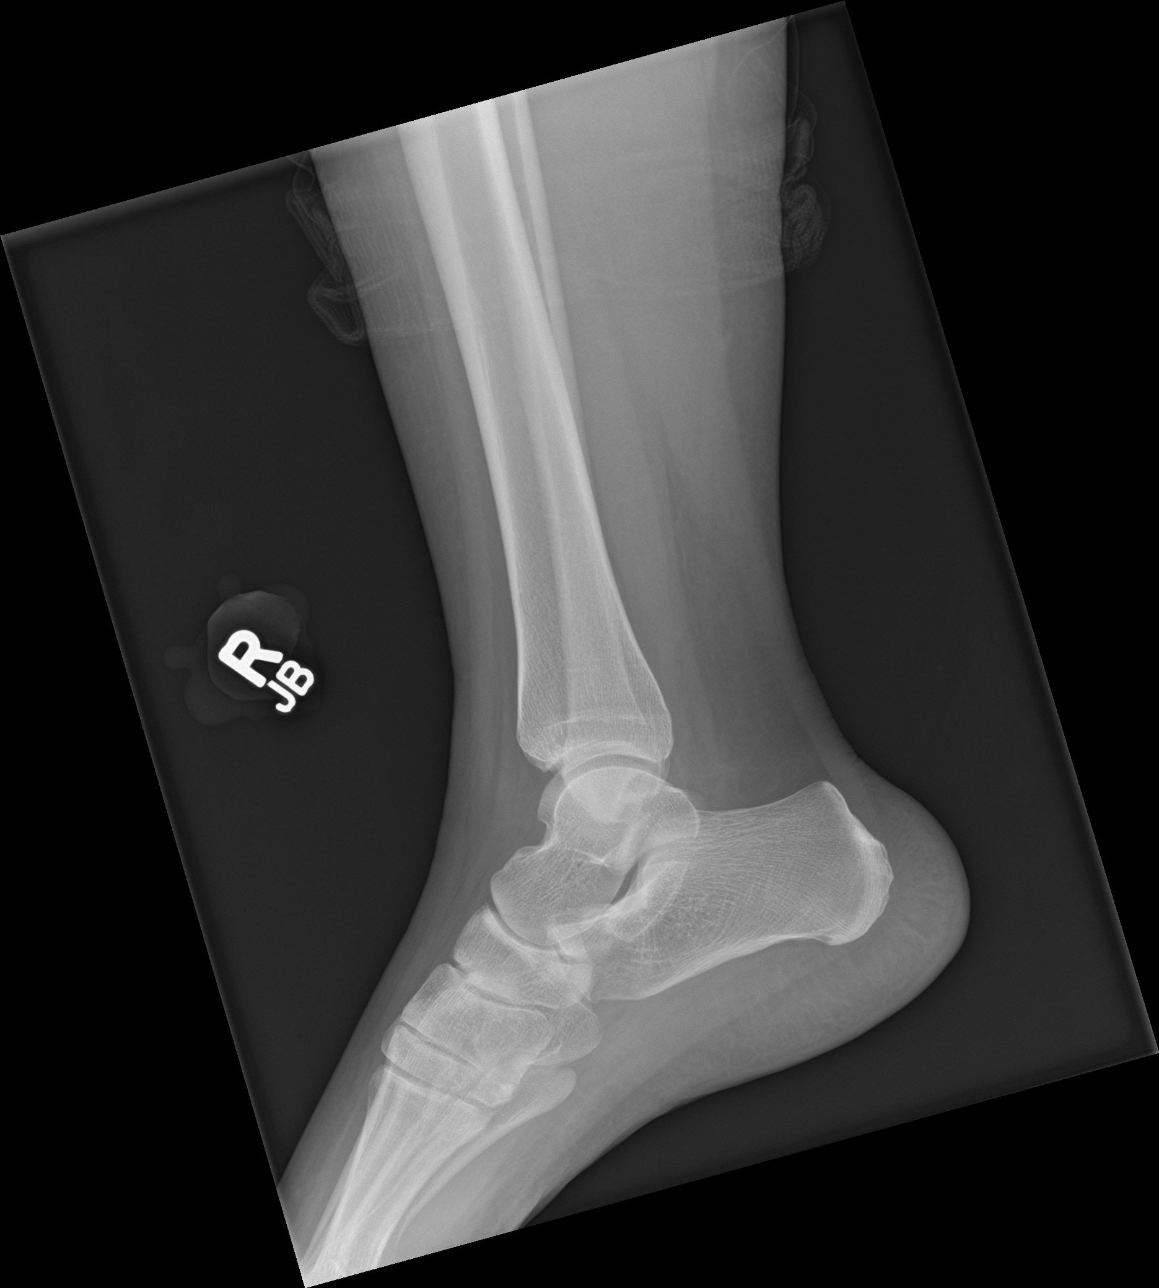

[3 of 3 positions shown; findings below may reference images not displayed]

FINDINGS: There are soft tissue swelling about the medial aspect of the ankle
and foot. There is no definite acute displaced fracture or
dislocation. There appears to be a well corticated osseous fragment
adjacent to the medial calcaneus, perhaps related to an old remote
injury.
IMPRESSION: Soft tissue swelling without evidence for an acute displaced
fracture.

## 2022-04-02 DIAGNOSIS — F439 Reaction to severe stress, unspecified: Secondary | ICD-10-CM | POA: Diagnosis not present

## 2022-04-14 DIAGNOSIS — Z862 Personal history of diseases of the blood and blood-forming organs and certain disorders involving the immune mechanism: Secondary | ICD-10-CM | POA: Diagnosis not present

## 2022-04-14 DIAGNOSIS — N939 Abnormal uterine and vaginal bleeding, unspecified: Secondary | ICD-10-CM | POA: Diagnosis not present

## 2022-04-14 DIAGNOSIS — Z5941 Food insecurity: Secondary | ICD-10-CM | POA: Diagnosis not present

## 2022-04-16 DIAGNOSIS — F319 Bipolar disorder, unspecified: Secondary | ICD-10-CM | POA: Diagnosis not present

## 2022-04-16 DIAGNOSIS — F439 Reaction to severe stress, unspecified: Secondary | ICD-10-CM | POA: Diagnosis not present

## 2022-04-22 DIAGNOSIS — R051 Acute cough: Secondary | ICD-10-CM | POA: Diagnosis not present

## 2022-04-22 DIAGNOSIS — J029 Acute pharyngitis, unspecified: Secondary | ICD-10-CM | POA: Diagnosis not present

## 2022-04-22 DIAGNOSIS — M791 Myalgia, unspecified site: Secondary | ICD-10-CM | POA: Diagnosis not present

## 2022-05-03 DIAGNOSIS — F4320 Adjustment disorder, unspecified: Secondary | ICD-10-CM | POA: Diagnosis not present

## 2022-05-09 DIAGNOSIS — Z7721 Contact with and (suspected) exposure to potentially hazardous body fluids: Secondary | ICD-10-CM | POA: Diagnosis not present

## 2022-05-13 DIAGNOSIS — F319 Bipolar disorder, unspecified: Secondary | ICD-10-CM | POA: Diagnosis not present

## 2022-05-14 DIAGNOSIS — F319 Bipolar disorder, unspecified: Secondary | ICD-10-CM | POA: Diagnosis not present

## 2022-05-24 DIAGNOSIS — F319 Bipolar disorder, unspecified: Secondary | ICD-10-CM | POA: Diagnosis not present

## 2022-06-03 DIAGNOSIS — R399 Unspecified symptoms and signs involving the genitourinary system: Secondary | ICD-10-CM | POA: Diagnosis not present

## 2022-06-03 DIAGNOSIS — R39198 Other difficulties with micturition: Secondary | ICD-10-CM | POA: Diagnosis not present

## 2022-06-03 DIAGNOSIS — R519 Headache, unspecified: Secondary | ICD-10-CM | POA: Diagnosis not present

## 2022-06-11 DIAGNOSIS — F319 Bipolar disorder, unspecified: Secondary | ICD-10-CM | POA: Diagnosis not present

## 2022-06-21 DIAGNOSIS — F319 Bipolar disorder, unspecified: Secondary | ICD-10-CM | POA: Diagnosis not present

## 2022-06-24 DIAGNOSIS — R300 Dysuria: Secondary | ICD-10-CM | POA: Diagnosis not present

## 2022-06-24 DIAGNOSIS — N3000 Acute cystitis without hematuria: Secondary | ICD-10-CM | POA: Diagnosis not present

## 2022-06-24 DIAGNOSIS — Z87442 Personal history of urinary calculi: Secondary | ICD-10-CM | POA: Diagnosis not present

## 2022-06-25 DIAGNOSIS — R112 Nausea with vomiting, unspecified: Secondary | ICD-10-CM | POA: Diagnosis not present

## 2022-06-25 DIAGNOSIS — K219 Gastro-esophageal reflux disease without esophagitis: Secondary | ICD-10-CM | POA: Diagnosis not present

## 2022-06-25 DIAGNOSIS — Z8719 Personal history of other diseases of the digestive system: Secondary | ICD-10-CM | POA: Diagnosis not present

## 2022-06-25 DIAGNOSIS — N390 Urinary tract infection, site not specified: Secondary | ICD-10-CM | POA: Diagnosis not present

## 2022-06-25 DIAGNOSIS — Z881 Allergy status to other antibiotic agents status: Secondary | ICD-10-CM | POA: Diagnosis not present

## 2022-06-25 DIAGNOSIS — R109 Unspecified abdominal pain: Secondary | ICD-10-CM | POA: Diagnosis not present

## 2022-06-25 DIAGNOSIS — Z880 Allergy status to penicillin: Secondary | ICD-10-CM | POA: Diagnosis not present

## 2022-06-25 DIAGNOSIS — R5383 Other fatigue: Secondary | ICD-10-CM | POA: Diagnosis not present

## 2022-06-27 DIAGNOSIS — M25532 Pain in left wrist: Secondary | ICD-10-CM | POA: Diagnosis not present

## 2022-06-27 DIAGNOSIS — J4520 Mild intermittent asthma, uncomplicated: Secondary | ICD-10-CM | POA: Diagnosis not present

## 2022-06-27 DIAGNOSIS — N390 Urinary tract infection, site not specified: Secondary | ICD-10-CM | POA: Diagnosis not present

## 2022-06-27 DIAGNOSIS — R030 Elevated blood-pressure reading, without diagnosis of hypertension: Secondary | ICD-10-CM | POA: Diagnosis not present

## 2022-07-10 DIAGNOSIS — F319 Bipolar disorder, unspecified: Secondary | ICD-10-CM | POA: Diagnosis not present

## 2022-07-11 DIAGNOSIS — F319 Bipolar disorder, unspecified: Secondary | ICD-10-CM | POA: Diagnosis not present

## 2022-07-26 DIAGNOSIS — F319 Bipolar disorder, unspecified: Secondary | ICD-10-CM | POA: Diagnosis not present

## 2022-08-06 DIAGNOSIS — Z1151 Encounter for screening for human papillomavirus (HPV): Secondary | ICD-10-CM | POA: Diagnosis not present

## 2022-08-06 DIAGNOSIS — R87610 Atypical squamous cells of undetermined significance on cytologic smear of cervix (ASC-US): Secondary | ICD-10-CM | POA: Diagnosis not present

## 2022-08-06 DIAGNOSIS — Z5321 Procedure and treatment not carried out due to patient leaving prior to being seen by health care provider: Secondary | ICD-10-CM | POA: Diagnosis not present

## 2022-08-12 ENCOUNTER — Telehealth (INDEPENDENT_AMBULATORY_CARE_PROVIDER_SITE_OTHER): Payer: Self-pay | Admitting: Primary Care

## 2022-08-12 NOTE — Telephone Encounter (Signed)
Spoke to pt. Will be at apt.

## 2022-08-13 ENCOUNTER — Encounter (INDEPENDENT_AMBULATORY_CARE_PROVIDER_SITE_OTHER): Payer: Self-pay | Admitting: Primary Care

## 2022-08-13 ENCOUNTER — Ambulatory Visit (INDEPENDENT_AMBULATORY_CARE_PROVIDER_SITE_OTHER): Payer: 59 | Admitting: Primary Care

## 2022-08-13 VITALS — BP 128/87 | HR 103 | Resp 16 | Ht 62.0 in | Wt 184.4 lb

## 2022-08-13 DIAGNOSIS — Z9189 Other specified personal risk factors, not elsewhere classified: Secondary | ICD-10-CM

## 2022-08-13 DIAGNOSIS — Z7251 High risk heterosexual behavior: Secondary | ICD-10-CM | POA: Diagnosis not present

## 2022-08-13 DIAGNOSIS — Z3202 Encounter for pregnancy test, result negative: Secondary | ICD-10-CM

## 2022-08-13 DIAGNOSIS — E6609 Other obesity due to excess calories: Secondary | ICD-10-CM | POA: Diagnosis not present

## 2022-08-13 DIAGNOSIS — Z7689 Persons encountering health services in other specified circumstances: Secondary | ICD-10-CM | POA: Diagnosis not present

## 2022-08-13 DIAGNOSIS — Z6833 Body mass index (BMI) 33.0-33.9, adult: Secondary | ICD-10-CM

## 2022-08-13 DIAGNOSIS — F419 Anxiety disorder, unspecified: Secondary | ICD-10-CM

## 2022-08-13 DIAGNOSIS — F32A Depression, unspecified: Secondary | ICD-10-CM | POA: Diagnosis not present

## 2022-08-13 DIAGNOSIS — A630 Anogenital (venereal) warts: Secondary | ICD-10-CM

## 2022-08-13 LAB — POCT URINE PREGNANCY: Preg Test, Ur: NEGATIVE

## 2022-08-13 NOTE — Progress Notes (Signed)
New Patient Office Visit  Subjective    Patient ID: Amanda Parsons, female    DOB: 1985-04-10  Age: 37 y.o. MRN: [id number]  CC:  Chief Complaint  Patient presents with   New Patient (Initial Visit)    HPI Amanda Parsons is a 37 year old female presents to establish care. She is sexually active and stopped her birthcontrol due to the combination of medication did not work for her. She works and stays in a hotel due to an eviction and unable to pay her rent she was laid off from her job she was not on her job to receive benefits therefore unable to receive unemployment. Patient has No headache, No chest pain, No abdominal pain - No Nausea, No new weakness tingling or numbness, No Cough - shortness of breath . She is exhausted with life but no suicidal ideations or homicidal. " No not really hallucination" biggest problem is anger and rage inability to get along with others.    Outpatient Encounter Medications as of 08/13/2022  Medication Sig   albuterol (VENTOLIN HFA) 108 (90 Base) MCG/ACT inhaler Inhale into the lungs.   cetirizine (ZYRTEC) 10 MG tablet Take by mouth.   divalproex (DEPAKOTE) 250 MG DR tablet    furosemide (LASIX) 20 MG tablet Take by mouth.   hydrOXYzine (ATARAX/VISTARIL) 25 MG tablet Take by mouth.   omeprazole (PRILOSEC) 20 MG capsule Take 1 capsule by mouth daily.   Probiotic Product (MISC INTESTINAL FLORA REGULAT) CAPS Take 1 tablet by mouth daily.   Tart Cherry 1200 MG CAPS Take 1 capsule by mouth 2 (two) times a week.   Turmeric 500 MG CAPS Take by mouth.   ferrous sulfate 325 (65 FE) MG tablet Take 1 tablet by mouth daily.   norethindrone (MICRONOR) 0.35 MG tablet Take 1 tablet by mouth daily. (Patient not taking: Reported on 08/13/2022)   ondansetron (ZOFRAN-ODT) 4 MG disintegrating tablet Take 4 mg by mouth every 8 (eight) hours as needed.   sertraline (ZOLOFT) 25 MG tablet Take 25 mg by mouth daily.   traZODone (DESYREL) 50 MG tablet Take by mouth. (Patient  not taking: Reported on 08/13/2022)   [DISCONTINUED] FLUoxetine (PROZAC) 10 MG tablet Take 10 mg by mouth daily.   [DISCONTINUED] lamoTRIgine (LAMICTAL) 25 MG tablet Take by mouth. (Patient not taking: Reported on 10/16/2020)   No facility-administered encounter medications on file as of 08/13/2022.    Past Medical History:  Diagnosis Date   COVID 01/2020    Past Surgical History:  Procedure Laterality Date   sinus cavity surgery      Family History  Problem Relation Age of Onset   Diabetes Mother    Hashimoto's thyroiditis Mother    Diabetes Other     Social History   Socioeconomic History   Marital status: Single    Spouse name: Not on file   Number of children: Not on file   Years of education: Not on file   Highest education level: Not on file  Occupational History   Not on file  Tobacco Use   Smoking status: Never   Smokeless tobacco: Never  Vaping Use   Vaping Use: Never used  Substance and Sexual Activity   Alcohol use: Not Currently   Drug use: Never   Sexual activity: Not on file  Other Topics Concern   Not on file  Social History Narrative   Not on file   Social Determinants of Health   Financial Resource Strain: Not on file  Food Insecurity: Not on file  Transportation Needs: Not on file  Physical Activity: Not on file  Stress: Not on file  Social Connections: Not on file  Intimate Partner Violence: Not on file    ROS Comprehensive ROS Pertinent positive and negative noted in HPI     Objective    Blood Pressure 128/87   Pulse (Abnormal) 103   Respiration 16   Height 5\' 2"  (1.575 m)   Weight 184 lb 6.4 oz (83.6 kg)   Oxygen Saturation 98%   Body Mass Index 33.73 kg/m   Physical Exam Constitutional:      Appearance: She is obese.  HENT:     Right Ear: Tympanic membrane and external ear normal.     Left Ear: Tympanic membrane and external ear normal.     Nose: Nose normal.  Eyes:     Extraocular Movements: Extraocular movements  intact.     Pupils: Pupils are equal, round, and reactive to light.  Cardiovascular:     Rate and Rhythm: Normal rate and regular rhythm.  Pulmonary:     Effort: Pulmonary effort is normal.     Breath sounds: Normal breath sounds.  Abdominal:     General: Bowel sounds are normal. There is distension.     Palpations: Abdomen is soft.  Musculoskeletal:        General: Normal range of motion.     Cervical back: Normal range of motion and neck supple.  Skin:    General: Skin is warm and dry.  Neurological:     Mental Status: She is alert and oriented to person, place, and time.  Psychiatric:        Mood and Affect: Mood normal.        Behavior: Behavior normal.      Assessment & Plan:  Dinna was seen today for new patient (initial visit).  Diagnoses and all orders for this visit:  Encounter to establish care  Anxiety and depression Followed outside of practice Flowsheet Row Office Visit from 08/13/2022 in [facility]  PHQ-9 Total Score 27       Class 1 obesity due to excess calories with serious comorbidity and body mass index (BMI) of 33.0 to 33.9 in adult Obesity is 30-39 indicating an excess in caloric intake or underlining conditions. This may lead to other co-morbidities. Educated on lifestyle modifications of diet and exercise which may reduce obesity.    HPV (human papilloma virus) anogenital infection -     Ambulatory referral to Gynecology  High risk of cardiac event Mother-  72 and father died of MI -4 -     Ambulatory referral to Cardiology  Unprotected sex -     POCT urine pregnancy    Grayce Sessions, NP

## 2022-08-13 NOTE — Patient Instructions (Signed)
Community Resources  Advocacy/Legal Legal Aid [place]:  1-866-219-5262  /  336-272-0148  Family Justice Center:  336-641-7233  [facility] [place] 24-hr Crisis line:  336-273-7273  Women's Resource Center, [place]:  336-275-6090  Court Watch (custody):  336-275-2346  [place] [facility]:   336-279-9299    Baby & Breastfeeding Car Seat Inspection @ Various [place] Fire Depts.- call 336-373-2177  [facility] [place]  336-832-6860  [facility] 336-878-6712  WIC: 336-641-3663 ([place]);  336-641-7571 ([place])  La Leche League:  1-877-452-5321   Childcare [place] [facility]: 336-369-5097 ([place]) / 336-887-8224 ([place])  - Child Care Resources/ Referrals/ Scholarships  - Head Start/ Early Head Start (call or apply online)  [place] DHHS: [place] Pre-K :  1-800-859-0829 / 336-274-5437   Employment / Job Search [facility] [place]: 336-275-6090 / [address]  [place] [employer] (JobLink): 336-373-5922 ([place]) / 336-882-4141 ([place])  [facility] [employer] Community Resource/ Career Center: 336-275-9801 / 336-282-7307  [facility] Job & Career Center: 336-373-3764  DHHS Work First: 336-641-3447 ([place]) / 336-641-3447 ([place])  [facility] [place]:  336-676-5871   Financial Assistance [place] [facility]:  336-553-2657  [employer]: 336-235-0368  [employer] (furniture):  336-370-4002  [place] [facility]: 336-373-4264  Low Income Energy Assistance  336-641-3000   Food Assistance DHHS- SNAP/ Food Stamps: 336-641-4588  WIC: [place]- 336-641-3663 ;  [place] 336-641-7571  Little Green Book- Free Meals  Little Blue Book- Free Food Pantries  During the summer, text "FOOD" to 877877   General Health / Clinics (Adults) Orange Card (for Adults) through [place] [facility]: (336) 895-4900  [facility]:   336-832-8035  [facility] [place] [facility]:   336-832-4444  Health Department:  336-641-3245  [facility]:  336-415-3877 / 336-641-2100  [facility] [place]:   336-373-0678  [facility]:   336-334-4822 x 50251   Housing [place] Housing Coalition:   336-691-9521  [place] Housing Authority:  336-275-8501  Affordable Housing Managemnt:  336-273-0568   Immigrant/ Refugee Center for [place]ians (UNCG):  336-256-1065  Fa[employer] [facility]:  336-379-0037  [employer]:  336-937-4701  [employer] Services:  336-617-0381  African Services Coalition:  336-574-2677   LGBTQ YouthSAFE  www.youthsafe[place].org  PFLAG  336-541-6754 / info@[place].org  The Trevor Project:  1-866-488-7386   Mental Health/ Substance Use [facility]  336-387-6161  [facility]:  336-832-9700 or 1-800-711-2635  [facility] of Care:  336-271-5888  [facility]:  336-294-1349  [facility]:  336-542-2884  [facility] (walk-ins)  336-676-6840 / [address]  [place]:  800-449-1287  Alcoholics Anonymous:  336-854-4278  Narcotics Anonymous:  800-365-1036  Quit Smoking Hotline:  800-QUIT-NOW (800-784-8669)   Parenting Children's Home Society:  800-632-1400  [place]: Education Center & Support Groups:  336-832-6682  YWCA: 336-273-3461  UNCG: Bringing Out the Best:  336-334-3120               Thriving at Three (Hispanic families): 336-256-1066  Healthy Start ([facility]):  336-387-6161 x2288  Parents as Teachers:  336-691-0024  [place] [facility]- Learning Together (Immigrants): 336-369-5001   Poison Control 800-222-1222  Sports & Recreation [employer] Open Doors Application: ymcanwnc.org/join/open-doors-financial-assistance/  City of [place] Recreation Centers: http://www.[place].gov/index.aspx?page=3615   Special Needs Family Support Network:  336-832-6507  Autism Society of [place]:   336-333-0197 x1402 or x1412 /  800-785-1035  TEACCH :  336-334-5773     ARC of [place]:  336-373-1076  Children's Developmental Service Agency (CDSA):  336-334-5601  CC4C (Care Coordination for Children):  336-641-7641   Transportation Medicaid Transportation: 336-641-4848 to apply  [place] Transit Authority: 336-335-6499 (reduced-fare bus ID to Medicaid/ Medicare/ Orange Card)  SCAT Paratransit services: Eligible riders only, call 336-333-6589 for application   Tutoring/Mentoring Black Child Development Institute: 336-230-2138  Big Brothers/ Big Sisters: 336-378-9100 ([place])  336-882-4167 ([place])  ACES through child's school: 336-370-2321  [employer] Achievers: contact your local Y  SHIELD Mentor Program: 336-337-2771   

## 2022-08-22 DIAGNOSIS — Z1151 Encounter for screening for human papillomavirus (HPV): Secondary | ICD-10-CM | POA: Diagnosis not present

## 2022-08-22 DIAGNOSIS — R87610 Atypical squamous cells of undetermined significance on cytologic smear of cervix (ASC-US): Secondary | ICD-10-CM | POA: Diagnosis not present

## 2022-08-22 DIAGNOSIS — B9689 Other specified bacterial agents as the cause of diseases classified elsewhere: Secondary | ICD-10-CM | POA: Diagnosis not present

## 2022-08-22 DIAGNOSIS — N760 Acute vaginitis: Secondary | ICD-10-CM | POA: Diagnosis not present

## 2022-10-02 DIAGNOSIS — N898 Other specified noninflammatory disorders of vagina: Secondary | ICD-10-CM | POA: Diagnosis not present

## 2022-10-18 DIAGNOSIS — G5600 Carpal tunnel syndrome, unspecified upper limb: Secondary | ICD-10-CM | POA: Insufficient documentation

## 2022-10-18 DIAGNOSIS — F3112 Bipolar disorder, current episode manic without psychotic features, moderate: Secondary | ICD-10-CM | POA: Insufficient documentation

## 2022-10-18 DIAGNOSIS — F4320 Adjustment disorder, unspecified: Secondary | ICD-10-CM | POA: Insufficient documentation

## 2022-10-22 ENCOUNTER — Encounter: Payer: Self-pay | Admitting: Cardiology

## 2022-10-22 ENCOUNTER — Ambulatory Visit: Payer: 59 | Attending: Cardiology | Admitting: Cardiology

## 2022-10-22 VITALS — BP 126/68 | HR 100 | Ht 63.0 in | Wt 194.0 lb

## 2022-10-22 DIAGNOSIS — R0609 Other forms of dyspnea: Secondary | ICD-10-CM

## 2022-10-22 DIAGNOSIS — R7303 Prediabetes: Secondary | ICD-10-CM

## 2022-10-22 DIAGNOSIS — R000 Tachycardia, unspecified: Secondary | ICD-10-CM

## 2022-10-22 DIAGNOSIS — R5383 Other fatigue: Secondary | ICD-10-CM

## 2022-10-22 DIAGNOSIS — F603 Borderline personality disorder: Secondary | ICD-10-CM

## 2022-10-22 NOTE — Progress Notes (Signed)
Cardiology Consultation:    Date:  10/22/2022   ID:  Amanda Parsons, DOB 07/22/1985, MRN [id number]  PCP:  Patient, No Pcp Per  Cardiologist:  Gypsy Balsam, MD   Referring MD: Grayce Sessions, NP   Chief Complaint  Patient presents with   Fatigue   Weight Gain   Tachycardia   Leg Swelling    On Lasix but takes as needed    History of Present Illness:    Amanda Parsons is a 37 y.o. female who is being seen today for the evaluation of multiple issues at the request of Grayce Sessions, NP.  Past medical history significant for fibromyalgia, borderline personality disorder, bipolar disorder, anxiety depression, prediabetes she was referred to [place] because of constellation of symptoms.  The biggest complaint appears to be fatigue and tiredness.  She said that she is waking up and she does not want to get off.  She would like to continue sleeping.  Also when she tried doing things she will get short of breath she is homeless she apparently sleeps in her car with the dog.  It is a very difficult situation we had a long discussion about it.  She also described to have some shortness of breath when she does things and she describes duration when he is trying to clean her car she will get short of breath that been going on for a while.  She also tells me about a year ago she had situation liquid swelled up and they told her that she had accumulated fluid.  She is to smoke she is to smoke marijuana but now does not do that she is to drink alcohol but stopped drinking it.  She does have family history of premature coronary artery disease.  However there are multiple family members that she does not have any contact with.  Past Medical History:  Diagnosis Date   COVID 01/2020    Past Surgical History:  Procedure Laterality Date   sinus cavity surgery      Current Medications: Current Meds  Medication Sig   albuterol (VENTOLIN HFA) 108 (90 Base) MCG/ACT inhaler Inhale 1 puff into  the lungs every 6 (six) hours as needed for wheezing or shortness of breath.   cetirizine (ZYRTEC) 10 MG tablet Take 10 mg by mouth daily.   divalproex (DEPAKOTE) 250 MG DR tablet Take 250 mg by mouth 2 (two) times daily.   ferrous sulfate 325 (65 FE) MG tablet Take 1 tablet by mouth daily.   furosemide (LASIX) 20 MG tablet Take 20 mg by mouth daily.   hydrOXYzine (ATARAX/VISTARIL) 25 MG tablet Take 25 mg by mouth every 8 (eight) hours as needed for anxiety or itching.   norethindrone (MICRONOR) 0.35 MG tablet Take 1 tablet by mouth daily.   omeprazole (PRILOSEC) 20 MG capsule Take 1 capsule by mouth daily.   ondansetron (ZOFRAN-ODT) 4 MG disintegrating tablet Take 4 mg by mouth every 8 (eight) hours as needed for vomiting or nausea.   Probiotic Product (MISC INTESTINAL FLORA REGULAT) CAPS Take 1 tablet by mouth daily.   sertraline (ZOLOFT) 25 MG tablet Take 25 mg by mouth daily.   Tart Cherry 1200 MG CAPS Take 1 capsule by mouth 2 (two) times a week.   [DISCONTINUED] traZODone (DESYREL) 50 MG tablet Take by mouth.   [DISCONTINUED] Turmeric 500 MG CAPS Take 1 tablet by mouth daily.     Allergies:   Chia oil (salvia hispanica), Citrullus vulgaris, Gluten meal, Ibuprofen, Latex,  Nitrofurantoin, Nsaids, Other, Pineapple, Plum pulp, Pork-derived products, Amoxicillin, Cat hair extract, Duloxetine, Levonorgestrel-ethinyl estrad, Metoclopramide, Pregabalin, and Tramadol   Social History   Socioeconomic History   Marital status: Single    Spouse name: Not on file   Number of children: Not on file   Years of education: Not on file   Highest education level: Not on file  Occupational History   Not on file  Tobacco Use   Smoking status: Never   Smokeless tobacco: Never  Vaping Use   Vaping status: Never Used  Substance and Sexual Activity   Alcohol use: Not Currently   Drug use: Never   Sexual activity: Not on file  Other Topics Concern   Not on file  Social History Narrative   Not on  file   Social Determinants of Health   Financial Resource Strain: High Risk (08/31/2021)   [facility] prior to 04/13/2022., [facility] visits prior to 04/13/2022.   Overall Financial Resource Strain (CARDIA)    Difficulty of Paying Living Expenses: Very hard  Food Insecurity: High Risk (06/26/2022)   [facility]   Hunger Vital Sign    Worried About Running Out of Food in the Last Year: Often true    Ran Out of Food in the Last Year: Often true  Transportation Needs: No Transportation Needs (06/26/2022)   [facility]   Transportation    In the past 12 months, has lack of reliable transportation kept you from medical appointments, meetings, work or from getting things needed for daily living? : No  Physical Activity: Sufficiently Active (08/31/2021)   [facility], [facility] to [facility]/13/2022[facility] visits prior to 04/13/2022.   Exercise Vital Sign    Days of Exercise per Week: 4 days    Minutes of Exercise per Session: 60 min  Stress: Stress Concern Present (08/31/2021)   [facility], [facility] prior to 04/13/2022., [facility] prior to 04/13/2022.   [employer] of Occupational Health - Occupational Stress Questionnaire    Feeling of Stress : Very much  Social Connections: Socially Isolated (08/31/2021)   [facility], [facility] prior to 04/13/2022., [facility] visits prior to 04/13/2022.   Social Advertising account executive [NHANES]    Frequency of Communication with Friends and Family: More than three times a week    Frequency of Social Gatherings with Friends and Family: Twice a  week    Attends Religious Services: Never    Database administrator or Organizations: No    Attends Engineer, structural: Never    Marital Status: Never married     Family History: The patient's family history includes Diabetes in her mother and another family member; Hashimoto's thyroiditis in her mother; Heart attack in her mother; Heart failure in her mother; Multiple sclerosis in her father. ROS:   Please see the history of present illness.    All 14 point review of systems negative except as described per history of present illness.  EKGs/Labs/Other Studies Reviewed:    The following studies were reviewed today:   EKG:       Recent Labs: No results found for requested labs within last  365 days.  Recent Lipid Panel No results found for: "CHOL", "TRIG", "HDL", "CHOLHDL", "VLDL", "LDLCALC", "LDLDIRECT"  Physical Exam:    VS:  BP 126/68 (BP Location: Left Arm, Patient Position: Sitting)   Pulse 100   Ht 5\' 3"  (1.6 m)   Wt 194 lb (88 kg)   SpO2 100%   BMI 34.37 kg/m     Wt Readings from Last 3 Encounters:  10/22/22 194 lb (88 kg)  08/13/22 184 lb 6.4 oz (83.6 kg)  10/16/20 165 lb (74.8 kg)     GEN:  Well nourished, well developed in no acute distress HEENT: Normal NECK: No JVD; No carotid bruits LYMPHATICS: No lymphadenopathy CARDIAC: RRR, no murmurs, no rubs, no gallops RESPIRATORY:  Clear to auscultation without rales, wheezing or rhonchi  ABDOMEN: Soft, non-tender, non-distended MUSCULOSKELETAL:  No edema; No deformity  SKIN: Warm and dry NEUROLOGIC:  Alert and oriented x 3 PSYCHIATRIC:  Normal affect   ASSESSMENT:    1. Rapid heart rate   2. Dyspnea on exertion   3. Fatigue, unspecified type   4. Prediabetes   5. Borderline personality disorder (HCC)    PLAN:    In order of problems listed above:  Complicated situation clearly there is a social issue going on.  In terms of cardiac problems I will ask her to have echocardiogram to assess  left ventricle ejection fraction.  That will help me to determine if her shortness of breath fatigue comes from heart point review which honestly I doubt.  I will also do some blood test including thyroid profile check complete metabolic panel check and CBC make sure were not missing any simple issues that can be easily corrected. Prediabetes again Chem-7 will be done to check on that. Multiple psychiatric issues would clearly contribute to this difficult and complicated situation.  She is determined to go back to a psychiatrist which I think will be tremendously beneficial for her   Medication Adjustments/Labs and Tests Ordered: Current medicines are reviewed at length with the patient today.  Concerns regarding medicines are outlined above.  Orders Placed This Encounter  Procedures   Basic metabolic panel   TSH   CBC   Lipid panel   EKG 12-Lead   ECHOCARDIOGRAM COMPLETE   No orders of the defined types were placed in this encounter.   Signed, Georgeanna Lea, MD, [facility]. 10/22/2022 5:02 PM    [facility]

## 2022-10-22 NOTE — Patient Instructions (Addendum)
Medication Instructions:  Your physician recommends that you continue on your current medications as directed. Please refer to the Current Medication list given to you today.  *If you need a refill on your cardiac medications before your next appointment, please call your pharmacy*   Lab Work: 3rd Floor   [address]  Your physician recommends that you return for lab work in: when lab is open  You need to have labs done when you are fasting.  You can come Monday through Friday 8:00 am to 11:30AM and 1:00 to 4:00. You do not need to make an appointment as the order has already been placed.     Testing/Procedures: Your physician has requested that you have an echocardiogram. Echocardiography is a painless test that uses sound waves to create images of your heart. It provides your doctor with information about the size and shape of your heart and how well your heart's chambers and valves are working. This procedure takes approximately one hour. There are no restrictions for this procedure. Please do NOT wear cologne, perfume, aftershave, or lotions (deodorant is allowed). Please arrive 15 minutes prior to your appointment time.    Follow-Up: At [facility], you and your health needs are our priority.  As part of our continuing mission to provide you with exceptional heart care, we have created designated Provider Care Teams.  These Care Teams include your primary Cardiologist (physician) and Advanced Practice Providers (APPs -  Physician Assistants and Nurse Practitioners) who all work together to provide you with the care you need, when you need it.  We recommend signing up for the patient portal called "MyChart".  Sign up information is provided on this After Visit Summary.  MyChart is used to connect with patients for Virtual Visits (Telemedicine).  Patients are able to view lab/test results, encounter notes, upcoming appointments, etc.  Non-urgent messages can be sent to your provider as  well.   To learn more about what you can do with MyChart, go to [email or web address].    Your next appointment:   2 month(s)  The format for your next appointment:   In Person  Provider:   Gypsy Balsam, MD    Other Instructions NA

## 2022-10-24 ENCOUNTER — Ambulatory Visit (HOSPITAL_BASED_OUTPATIENT_CLINIC_OR_DEPARTMENT_OTHER)
Admission: RE | Admit: 2022-10-24 | Discharge: 2022-10-24 | Disposition: A | Discharge status: Home / Self Care | Payer: 59 | Source: Ambulatory Visit | Attending: Cardiology | Admitting: Cardiology

## 2022-10-24 DIAGNOSIS — R000 Tachycardia, unspecified: Secondary | ICD-10-CM | POA: Insufficient documentation

## 2022-10-24 DIAGNOSIS — R0609 Other forms of dyspnea: Secondary | ICD-10-CM | POA: Insufficient documentation

## 2022-10-24 LAB — ECHOCARDIOGRAM COMPLETE
AR max vel: 2.41 cm2
AV Area VTI: 2.36 cm2
AV Area mean vel: 2.28 cm2
AV Mean grad: 5 mmHg
AV Peak grad: 7.8 mmHg
Ao pk vel: 1.4 m/s
Area-P 1/2: 4.49 cm2
Calc EF: 61.3 %
S' Lateral: 2.7 cm
Single Plane A2C EF: 60.8 %
Single Plane A4C EF: 62.3 %

## 2022-10-25 ENCOUNTER — Telehealth: Payer: Self-pay

## 2022-10-25 NOTE — Telephone Encounter (Signed)
-----   Message from Gypsy Balsam sent at 10/25/2022 12:23 PM EDT ----- Echocardiogram showed preserved left ventricle ejection fraction overall looks good

## 2022-10-25 NOTE — Telephone Encounter (Signed)
Patient notified through my chart.

## 2022-11-10 DIAGNOSIS — Z880 Allergy status to penicillin: Secondary | ICD-10-CM | POA: Diagnosis not present

## 2022-11-10 DIAGNOSIS — R202 Paresthesia of skin: Secondary | ICD-10-CM | POA: Diagnosis not present

## 2022-11-10 DIAGNOSIS — Z3A01 Less than 8 weeks gestation of pregnancy: Secondary | ICD-10-CM | POA: Diagnosis not present

## 2022-11-10 DIAGNOSIS — F411 Generalized anxiety disorder: Secondary | ICD-10-CM | POA: Diagnosis not present

## 2022-11-10 DIAGNOSIS — Z91018 Allergy to other foods: Secondary | ICD-10-CM | POA: Diagnosis not present

## 2022-11-10 DIAGNOSIS — O26891 Other specified pregnancy related conditions, first trimester: Secondary | ICD-10-CM | POA: Diagnosis not present

## 2022-11-10 DIAGNOSIS — Z91012 Allergy to eggs: Secondary | ICD-10-CM | POA: Diagnosis not present

## 2022-11-10 DIAGNOSIS — M797 Fibromyalgia: Secondary | ICD-10-CM | POA: Diagnosis not present

## 2022-11-10 DIAGNOSIS — Z91040 Latex allergy status: Secondary | ICD-10-CM | POA: Diagnosis not present

## 2022-11-10 DIAGNOSIS — Z885 Allergy status to narcotic agent status: Secondary | ICD-10-CM | POA: Diagnosis not present

## 2022-11-10 DIAGNOSIS — Z888 Allergy status to other drugs, medicaments and biological substances status: Secondary | ICD-10-CM | POA: Diagnosis not present

## 2022-11-10 DIAGNOSIS — Z9109 Other allergy status, other than to drugs and biological substances: Secondary | ICD-10-CM | POA: Diagnosis not present

## 2022-11-10 DIAGNOSIS — F4310 Post-traumatic stress disorder, unspecified: Secondary | ICD-10-CM | POA: Diagnosis not present

## 2022-11-18 DIAGNOSIS — Z3A16 16 weeks gestation of pregnancy: Secondary | ICD-10-CM | POA: Diagnosis not present

## 2022-11-18 DIAGNOSIS — Z3687 Encounter for antenatal screening for uncertain dates: Secondary | ICD-10-CM | POA: Diagnosis not present

## 2022-11-18 DIAGNOSIS — O09522 Supervision of elderly multigravida, second trimester: Secondary | ICD-10-CM | POA: Diagnosis not present

## 2022-11-18 DIAGNOSIS — O99210 Obesity complicating pregnancy, unspecified trimester: Secondary | ICD-10-CM | POA: Diagnosis not present

## 2022-11-18 DIAGNOSIS — O99212 Obesity complicating pregnancy, second trimester: Secondary | ICD-10-CM | POA: Diagnosis not present

## 2022-12-08 ENCOUNTER — Inpatient Hospital Stay (HOSPITAL_COMMUNITY)
Admission: AD | Admit: 2022-12-08 | Discharge: 2022-12-08 | Disposition: A | Discharge status: Home / Self Care | Payer: 59 | Attending: Obstetrics & Gynecology | Admitting: Obstetrics & Gynecology

## 2022-12-08 ENCOUNTER — Encounter (HOSPITAL_COMMUNITY): Payer: Self-pay | Admitting: Obstetrics & Gynecology

## 2022-12-08 ENCOUNTER — Other Ambulatory Visit: Payer: Self-pay

## 2022-12-08 DIAGNOSIS — O26892 Other specified pregnancy related conditions, second trimester: Secondary | ICD-10-CM | POA: Insufficient documentation

## 2022-12-08 DIAGNOSIS — O26891 Other specified pregnancy related conditions, first trimester: Secondary | ICD-10-CM | POA: Diagnosis not present

## 2022-12-08 DIAGNOSIS — Z3A19 19 weeks gestation of pregnancy: Secondary | ICD-10-CM | POA: Diagnosis not present

## 2022-12-08 DIAGNOSIS — R42 Dizziness and giddiness: Secondary | ICD-10-CM | POA: Diagnosis not present

## 2022-12-08 DIAGNOSIS — M797 Fibromyalgia: Secondary | ICD-10-CM | POA: Diagnosis not present

## 2022-12-08 DIAGNOSIS — R55 Syncope and collapse: Secondary | ICD-10-CM | POA: Insufficient documentation

## 2022-12-08 HISTORY — DX: Anemia, unspecified: D64.9

## 2022-12-08 HISTORY — DX: Bipolar disorder, unspecified: F31.9

## 2022-12-08 HISTORY — DX: Fibromyalgia: M79.7

## 2022-12-08 HISTORY — DX: Post-traumatic stress disorder, unspecified: F43.10

## 2022-12-08 HISTORY — DX: Disorder of thyroid, unspecified: E07.9

## 2022-12-08 HISTORY — DX: Borderline personality disorder: F60.3

## 2022-12-08 HISTORY — DX: Depression, unspecified: F32.A

## 2022-12-08 LAB — COMPREHENSIVE METABOLIC PANEL
ALT: 31 U/L (ref 0–44)
AST: 21 U/L (ref 15–41)
Albumin: 2.9 g/dL — ABNORMAL LOW (ref 3.5–5.0)
Alkaline Phosphatase: 70 U/L (ref 38–126)
Anion gap: 9 (ref 5–15)
BUN: 7 mg/dL (ref 6–20)
CO2: 23 mmol/L (ref 22–32)
Calcium: 9.1 mg/dL (ref 8.9–10.3)
Chloride: 105 mmol/L (ref 98–111)
Creatinine, Ser: 0.47 mg/dL (ref 0.44–1.00)
GFR, Estimated: >60 mL/min (ref >60)
Glucose, Bld: 79 mg/dL (ref 70–99)
Potassium: 3.9 mmol/L (ref 3.5–5.1)
Sodium: 137 mmol/L (ref 135–145)
Total Bilirubin: 0.2 mg/dL — ABNORMAL LOW (ref 0.3–1.2)
Total Protein: 6.6 g/dL (ref 6.5–8.1)

## 2022-12-08 LAB — URINALYSIS, ROUTINE W REFLEX MICROSCOPIC
Bilirubin Urine: NEGATIVE
Glucose, UA: NEGATIVE mg/dL
Hgb urine dipstick: NEGATIVE
Ketones, ur: 5 mg/dL — AB
Nitrite: NEGATIVE
Protein, ur: 30 mg/dL — AB
Specific Gravity, Urine: 1.027 (ref 1.005–1.030)
pH: 5 (ref 5.0–8.0)

## 2022-12-08 LAB — CBC
HCT: 34.7 % — ABNORMAL LOW (ref 36.0–46.0)
Hemoglobin: 11 g/dL — ABNORMAL LOW (ref 12.0–15.0)
MCH: 25.5 pg — ABNORMAL LOW (ref 26.0–34.0)
MCHC: 31.7 g/dL (ref 30.0–36.0)
MCV: 80.3 fL (ref 80.0–100.0)
Platelets: 418 10*3/uL — ABNORMAL HIGH (ref 150–400)
RBC: 4.32 MIL/uL (ref 3.87–5.11)
RDW: 14.2 % (ref 11.5–15.5)
WBC: 11.4 10*3/uL — ABNORMAL HIGH (ref 4.0–10.5)
nRBC: 0 % (ref 0.0–0.2)

## 2022-12-08 LAB — HEMOGLOBIN A1C
Hgb A1c MFr Bld: 5.9 % — ABNORMAL HIGH (ref 4.8–5.6)
Mean Plasma Glucose: 122.63 mg/dL

## 2022-12-08 LAB — TSH: TSH: 1.61 u[IU]/mL (ref 0.350–4.500)

## 2022-12-08 NOTE — MAU Provider Note (Signed)
History     CSN: [id number]  Arrival date and time: 12/08/22 1826   Event Date/Time   First Provider Initiated Contact with Patient 12/08/22 1938      Chief Complaint  Patient presents with   Dizziness   Near Syncope   HPI Patient is 37 y.o. G1P0 [redacted]w[redacted]d here with complaints of dizziness and almost passing out. She reports having chronic fatigue this pregnancy with lower energy but today she had acute onset of symptoms. 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 She then bent over and stood up-- she felt faint and saw sparkles. She reports almost passing out. She ate food, drank water and these did not help. 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   She has a history of thryoid dysfunction and was treated with levothyroxine about 5 years ago but is not on medications now. She has not had had her levels checked in pregnancy.   Kitty also reports know prediabetes but has not be checked in this pregnancy. Significant family history of diabetes and her mother had GDM.   Patient with a history of fibromyalgia, typically controlled with supplements ginger, turmeric and curcumin. She was not tolerating these in early pregnancy.   +FM-flutters, denies LOF, VB, contractions, vaginal discharge.  Has received some limited care at [facility]--early anatomy scan on 11/18/22. No labs have been drawn, noted in Care Everywhere and confirmed with the patient.   OB History     Gravida  1   Para      Term      Preterm      AB      Living         SAB      IAB      Ectopic      Multiple      Live Births              Past Medical History:  Diagnosis Date   Anemia    Bipolar and related disorder (HCC)    Borderline personality disorder (HCC)    COVID 01/2020   Depression    Fibromyalgia    PTSD (post-traumatic stress disorder)    Thyroid disease during pregnancy      Past Surgical History:  Procedure Laterality Date   sinus cavity surgery         Social History   Tobacco Use   Smoking status: Never   Smokeless tobacco: Never  Vaping Use   Vaping status: Never Used  Substance Use Topics   Alcohol use: Not Currently   Drug use: Never    Allergies:  Allergies  Allergen Reactions   Chia Oil (Salvia Hispanica) Anaphylaxis   Citrullus Vulgaris Anaphylaxis, Itching and Rash   Gluten Meal Anaphylaxis, Hives, Itching, Other (See Comments) and Rash   Ibuprofen Swelling   Latex Itching, Rash and Swelling   Nitrofurantoin Anaphylaxis and Other (See Comments)   Nsaids Other (See Comments)    Can't remember reaction but pt states she ended up in the hospital and has happened more than once     Other Anaphylaxis, Other (See Comments) and Rash    Apples,   chili peppers -rash   Seizures  Apples,   chili peppers -rash   Apple   Pineapple Anaphylaxis, Itching and Rash   Plum Pulp Anaphylaxis   Pork-Derived Products Anaphylaxis, Hives, Itching  and Rash   Amoxicillin Other (See Comments)    Caused an ulcer in esophagus near her heart per pt. Pt also stated she can not have high doses of antibiotics.    Caused an ulcer in esophagus near her heart per pt. Pt also stated she can not have high doses of antibiotics.       Cat Hair Extract Itching   Duloxetine Swelling   Levonorgestrel-Ethinyl Estrad Swelling   Metoclopramide Other (See Comments)    seizures seizures    Pregabalin Other (See Comments)    Can't remember Can't remember    Tramadol Other (See Comments)    seizure seizure     Medications Prior to Admission  Medication Sig Dispense Refill Last Dose   cetirizine (ZYRTEC) 10 MG tablet Take 10 mg by mouth daily.   12/08/2022   omeprazole (PRILOSEC) 20 MG capsule Take 1 capsule by mouth daily.   12/08/2022   ondansetron (ZOFRAN-ODT) 4 MG disintegrating tablet Take 4 mg by mouth every 8 (eight) hours as needed for vomiting  or nausea.   Past Week   Probiotic Product (MISC INTESTINAL FLORA REGULAT) CAPS Take 1 tablet by mouth daily.   12/08/2022   sertraline (ZOLOFT) 25 MG tablet Take 25 mg by mouth daily.   12/08/2022   Tart Cherry 1200 MG CAPS Take 1 capsule by mouth 2 (two) times a week. Drinks in juice form   12/08/2022   albuterol (VENTOLIN HFA) 108 (90 Base) MCG/ACT inhaler Inhale 1 puff into the lungs every 6 (six) hours as needed for wheezing or shortness of breath.      divalproex (DEPAKOTE) 250 MG DR tablet Take 250 mg by mouth 2 (two) times daily.      ferrous sulfate 325 (65 FE) MG tablet Take 1 tablet by mouth daily.      furosemide (LASIX) 20 MG tablet Take 20 mg by mouth daily.      hydrOXYzine (ATARAX/VISTARIL) 25 MG tablet Take 25 mg by mouth every 8 (eight) hours as needed for anxiety or itching.      norethindrone (MICRONOR) 0.35 MG tablet Take 1 tablet by mouth daily.       Review of Systems  Constitutional:  Negative for chills and fever.  HENT:  Negative for congestion and sore throat.   Eyes:  Negative for pain and visual disturbance.  Respiratory:  Negative for cough, chest tightness and shortness of breath.   Cardiovascular:  Negative for chest pain.  Gastrointestinal:  Negative for abdominal pain, diarrhea, nausea and vomiting.  Endocrine: Negative for cold intolerance and heat intolerance.  Genitourinary:  Negative for dysuria and flank pain.  Musculoskeletal:  Negative for back pain.  Skin:  Negative for rash.  Allergic/Immunologic: Negative for food allergies.  Neurological:  Positive for dizziness and light-headedness.  Psychiatric/Behavioral:  Negative for agitation.    Physical Exam   Blood pressure 127/82, pulse (!) 113, temperature 98.1 F (36.7 C), temperature source Oral, resp. rate 18, height 5\' 3"  (1.6 m), weight 90.9 kg, last menstrual period 07/27/2022, SpO2 99%.  Patient Vitals for the past 24 hrs:  BP Temp Temp src Pulse Resp SpO2 Height Weight  12/08/22 1856  127/82 -- -- (!) 113 -- -- -- --  12/08/22 1845 132/81 98.1 F (36.7 C) Oral (!) 115 18 99 % 5\' 3"  (1.6 m) 90.9 kg   Orthostatic VS for the past 24 hrs (Last 3 readings):  BP- Lying Pulse- Lying BP- Sitting Pulse- Sitting BP- Standing at 0  minutes Pulse- Standing at 0 minutes BP- Standing at 3 minutes Pulse- Standing at 3 minutes  12/08/22 2041 122/72 91 125/78 98 141/86 107 105/69 103   Physical Exam Vitals and nursing note reviewed.  Constitutional:      General: She is not in acute distress.    Appearance: She is well-developed.     Comments: Pregnant female  HENT:     Head: Normocephalic and atraumatic.  Eyes:     General: No scleral icterus.    Conjunctiva/sclera: Conjunctivae normal.  Cardiovascular:     Rate and Rhythm: Normal rate.  Pulmonary:     Effort: Pulmonary effort is normal.  Chest:     Chest wall: No tenderness.  Abdominal:     Palpations: Abdomen is soft.     Tenderness: There is no abdominal tenderness. There is no guarding or rebound.     Comments: Gravid  Genitourinary:    Vagina: Normal.  Musculoskeletal:        General: Normal range of motion.     Cervical back: Normal range of motion and neck supple.  Skin:    General: Skin is warm and dry.     Findings: No rash.  Neurological:     Mental Status: She is alert and oriented to person, place, and time.     MAU Course  Procedures   MDM: high  This patient presents to the ED for concern of   Chief Complaint  Patient presents with   Dizziness   Near Syncope     These complains involves an extensive number of treatment options, and is a complaint that carries with it a high risk of complications and morbidity.  The differential diagnosis for  1. Presyncope has a broad ddx in this client. Most likely normal variant with low BP in the second trimester and positional changes that aggravated her symptoms. Recently seen by cardiology thus cardiogenic nature of syncope is not likely as EF was WNL and  heart structurally normal in Sept 2024.  Contributing history makes thyroid or glucose metabolism as possible culprits of her symptoms.   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   Mood disorder (HCC) 12/06/2021   Suicidal ideations 12/06/2021   Borderline personality disorder (HCC) 04/27/2020   DDD (degenerative disc disease), lumbar 08/30/2019   Anxiety and depression 07/28/2019   Anemia 07/28/2019   Elevated LFTs 07/28/2019   Fibromyalgia 07/28/2019   History of thyroid disorder 07/28/2019   Prediabetes 07/28/2019   Tremors of nervous system 07/28/2019   Thrombocytosis 07/28/2019   External records from outside source obtained and reviewed including CareEverywhere and Prenatal care records  Lab Tests: UA, CMP, CBC, and Other TSH Ha1c  I ordered, and personally interpreted labs.  The pertinent results include:   Results for orders placed or performed during the hospital encounter of 12/08/22 (from the past 24 hour(s))  Urinalysis, Routine w reflex microscopic -Urine, Clean Catch     Status: Abnormal   Collection Time: 12/08/22  6:50 PM  Result Value Ref Range   Color, Urine AMBER (A) YELLOW   APPearance CLOUDY (A) CLEAR   Specific Gravity, Urine 1.027 1.005 - 1.030   pH 5.0 5.0 - 8.0  Glucose, UA NEGATIVE NEGATIVE mg/dL   Hgb urine dipstick NEGATIVE NEGATIVE   Bilirubin Urine NEGATIVE NEGATIVE   Ketones, ur 5 (A) NEGATIVE mg/dL   Protein, ur 30 (A) NEGATIVE mg/dL   Nitrite NEGATIVE NEGATIVE   Leukocytes,Ua MODERATE (A) NEGATIVE   RBC / HPF 6-10 0 - 5 RBC/hpf   WBC, UA 11-20 0 - 5 WBC/hpf   Bacteria, UA RARE (A) NONE SEEN   Squamous Epithelial / HPF 21-50 0 - 5  /HPF   Mucus PRESENT   Comprehensive metabolic panel     Status: Abnormal   Collection Time: 12/08/22  9:12 PM  Result Value Ref Range   Sodium 137 135 - 145 mmol/L   Potassium 3.9 3.5 - 5.1 mmol/L   Chloride 105 98 - 111 mmol/L   CO2 23 22 - 32 mmol/L   Glucose, Bld 79 70 - 99 mg/dL   BUN 7 6 - 20 mg/dL   Creatinine, Ser 9.14 0.44 - 1.00 mg/dL   Calcium 9.1 8.9 - 78.2 mg/dL   Total Protein 6.6 6.5 - 8.1 g/dL   Albumin 2.9 (L) 3.5 - 5.0 g/dL   AST 21 15 - 41 U/L   ALT 31 0 - 44 U/L   Alkaline Phosphatase 70 38 - 126 U/L   Total Bilirubin 0.2 (L) 0.3 - 1.2 mg/dL   GFR, Estimated >95 >62 mL/min   Anion gap 9 5 - 15  CBC     Status: Abnormal   Collection Time: 12/08/22  9:12 PM  Result Value Ref Range   WBC 11.4 (H) 4.0 - 10.5 K/uL   RBC 4.32 3.87 - 5.11 MIL/uL   Hemoglobin 11.0 (L) 12.0 - 15.0 g/dL   HCT 13.0 (L) 86.5 - 78.4 %   MCV 80.3 80.0 - 100.0 fL   MCH 25.5 (L) 26.0 - 34.0 pg   MCHC 31.7 30.0 - 36.0 g/dL   RDW 69.6 29.5 - 28.4 %   Platelets 418 (H) 150 - 400 K/uL   nRBC 0.0 0.0 - 0.2 %  Hemoglobin A1c     Status: Abnormal   Collection Time: 12/08/22  9:12 PM  Result Value Ref Range   Hgb A1c MFr Bld 5.9 (H) 4.8 - 5.6 %   Mean Plasma Glucose 122.63 mg/dL  TSH     Status: None   Collection Time: 12/08/22  9:12 PM  Result Value Ref Range   TSH 1.610 0.350 - 4.500 uIU/mL     Imaging Studies: reviewed results form Anatomy Scan on 11/18/22 at [facility]  Cardiac Testing/Monitoring:  EKG was ordered today. I personally reviewed the EKG. No acute ST elevations or depressions.    Medicines ordered and prescription drug management:   Medications: None   Reevaluation of the patient after these medicines showed that the patient improved I have reviewed the patients home medicines and have made adjustments as needed   MAU Course:  22:40 Discussed negative results with patient. Ok for discharge   After the interventions noted above, I reevaluated the patient and  found that they have :improved  Dispostion: admitted to the hospital   Assessment and Plan   1. Dizziness   2. Postural dizziness with presyncope   3. [redacted] weeks gestation of pregnancy    - Most likely normal variant given negative work up - reviewed and recommended use of compression stalkings - Given list of providers that deliver at [facility] - Reviewed return precautions  Allergies as of 12/08/2022  Reactions   Chia Oil (salvia Hispanica) Anaphylaxis   Citrullus Vulgaris Anaphylaxis, Itching, Rash   Gluten Meal Anaphylaxis, Hives, Itching, Other (See Comments), Rash   Ibuprofen Swelling   Latex Itching, Rash, Swelling   Nitrofurantoin Anaphylaxis, Other (See Comments)   Nsaids Other (See Comments)   Can't remember reaction but pt states she ended up in the hospital and has happened more than once     Other Anaphylaxis, Other (See Comments), Rash   Apples,   chili peppers -rash   Seizures Apples,   chili peppers -rash  Apple   Pineapple Anaphylaxis, Itching, Rash   Plum Pulp Anaphylaxis   Pork-derived Products Anaphylaxis, Hives, Itching, Rash   Amoxicillin Other (See Comments)   Caused an ulcer in esophagus near her heart per pt. Pt also stated she can not have high doses of antibiotics.    Caused an ulcer in esophagus near her heart per pt. Pt also stated she can not have high doses of antibiotics.      Cat Hair Extract Itching   Duloxetine Swelling   Levonorgestrel-ethinyl Estrad Swelling   Metoclopramide Other (See Comments)   seizures seizures   Pregabalin Other (See Comments)   Can't remember Can't remember   Tramadol Other (See Comments)   seizure seizure        Medication List     STOP taking these medications    divalproex 250 MG DR tablet Commonly known as: DEPAKOTE   furosemide 20 MG tablet Commonly known as: LASIX   norethindrone 0.35 MG tablet Commonly known as: MICRONOR       TAKE these medications    albuterol 108 (90 Base)  MCG/ACT inhaler Commonly known as: VENTOLIN HFA Inhale 1 puff into the lungs every 6 (six) hours as needed for wheezing or shortness of breath.   cetirizine 10 MG tablet Commonly known as: ZYRTEC Take 10 mg by mouth daily.   ferrous sulfate 325 (65 FE) MG tablet Take 1 tablet by mouth daily.   hydrOXYzine 25 MG tablet Commonly known as: ATARAX Take 25 mg by mouth every 8 (eight) hours as needed for anxiety or itching.   Misc Intestinal Flora Regulat Caps Take 1 tablet by mouth daily.   omeprazole 20 MG capsule Commonly known as: PRILOSEC Take 1 capsule by mouth daily.   ondansetron 4 MG disintegrating tablet Commonly known as: ZOFRAN-ODT Take 4 mg by mouth every 8 (eight) hours as needed for vomiting or nausea.   sertraline 25 MG tablet Commonly known as: ZOLOFT Take 25 mg by mouth daily.   Tart Cherry 1200 MG Caps Take 1 capsule by mouth 2 (two) times a week. Drinks in juice form        Federico Flake 12/08/2022, 11:26 PM

## 2022-12-08 NOTE — MAU Note (Signed)
.  Amanda Parsons is a 37 y.o. at [redacted]w[redacted]d here in MAU reporting: near syncopal episode two hours ago.  Pt also reports ongoing dizziness and lower abd pain   Onset of complaint:  Pain score: 5 Vitals:   12/08/22 1845 12/08/22 1856  BP: 132/81 127/82  Pulse: (!) 115 (!) 113  Resp: 18   Temp: 98.1 F (36.7 C)   SpO2: 99%       Lab orders placed from triage:   ua

## 2022-12-08 NOTE — Discharge Instructions (Addendum)
Safe Medications in Pregnancy    Acne: Benzoyl Peroxide Salicylic Acid  Backache/Headache: Tylenol: 2 regular strength every 4 hours OR              2 Extra strength every 6 hours  Colds/Coughs/Allergies: Benadryl (alcohol free) 25 mg every 6 hours as needed Breath right strips Claritin Cepacol throat lozenges Chloraseptic throat spray Cold-Eeze- up to three times per day Cough drops, alcohol free Flonase (by prescription only) Guaifenesin Mucinex Robitussin DM (plain only, alcohol free) Saline nasal spray/drops Sudafed (pseudoephedrine) & Actifed ** use only after [redacted] weeks gestation and if you do not have high blood pressure Tylenol Vicks Vaporub Zinc lozenges Zyrtec   Constipation: Colace Ducolax suppositories Fleet enema Glycerin suppositories Metamucil Milk of magnesia Miralax Senokot Smooth move tea  Diarrhea: Kaopectate Imodium A-D  *NO pepto Bismol  Hemorrhoids: Anusol Anusol HC Preparation H Tucks  Indigestion: Tums Maalox Mylanta Zantac  Pepcid  Insomnia: Benadryl (alcohol free) 25mg  every 6 hours as needed Tylenol PM Unisom, no Gelcaps  Leg Cramps: Tums MagGel  Nausea/Vomiting:  Bonine Dramamine Emetrol Ginger extract Sea bands Meclizine  Nausea medication to take during pregnancy:  Unisom (doxylamine succinate 25 mg tablets) Take one tablet daily at bedtime. If symptoms are not adequately controlled, the dose can be increased to a maximum recommended dose of two tablets daily (1/2 tablet in the morning, 1/2 tablet mid-afternoon and one at bedtime). Vitamin B6 100mg  tablets. Take one tablet twice a day (up to 200 mg per day).  Skin Rashes: Aveeno products Benadryl cream or 25mg  every 6 hours as needed Calamine Lotion 1% cortisone cream  Yeast infection: Gyne-lotrimin 7 Monistat 7   **If taking multiple medications, please check labels to avoid duplicating the same active ingredients **take  medication as directed on the label ** Do not exceed 4000 mg of tylenol in 24 hours **Do not take medications that contain aspirin or ibuprofen     [place] [facility] [employer] at [facility]             [address] [place], [place] [address] [place] [facility] [employer] at [place], [place], [place], [place], [address] [phone]  Center for [facility] at [place], [place], [place], [place], [address] [phone]  Center for [facility] at [place], [place], [place], [place], [address] [phone]  Center for [place] at [facility]                                 [place], [place], [place], [address] [phone]  Center for [place] at [facility]  [address] [place], [place], [address] [phone]  Center for [facility] at [facility] [address], [place], [place], [address]                              [facility] [place] [address], [place], [place], [address] [phone]  [facility]         Phone: [phone]  [facility]      Phone: [phone]   [facility]      Phone: [phone]  [facility]         Phone: [phone]   [facility] Department-M[facility]    Phone: [phone]  [facility]      Phone: [phone]  [facility]     Phone: [phone]  [facility]        Phone: [phone]  [facility] ([address] [place]) [phone]  [facility]      Phone: [phone]

## 2022-12-09 LAB — GLUCOSE, CAPILLARY: Glucose-Capillary: 116 mg/dL — ABNORMAL HIGH (ref 70–99)

## 2022-12-10 LAB — CULTURE, OB URINE: Culture: 20000 — AB

## 2022-12-15 DIAGNOSIS — F410 Panic disorder [episodic paroxysmal anxiety] without agoraphobia: Secondary | ICD-10-CM | POA: Diagnosis not present

## 2022-12-15 DIAGNOSIS — Z3A20 20 weeks gestation of pregnancy: Secondary | ICD-10-CM | POA: Diagnosis not present

## 2022-12-15 DIAGNOSIS — O99340 Other mental disorders complicating pregnancy, unspecified trimester: Secondary | ICD-10-CM | POA: Diagnosis not present

## 2022-12-15 DIAGNOSIS — R109 Unspecified abdominal pain: Secondary | ICD-10-CM | POA: Diagnosis not present

## 2022-12-15 DIAGNOSIS — O99342 Other mental disorders complicating pregnancy, second trimester: Secondary | ICD-10-CM | POA: Diagnosis not present

## 2022-12-18 DIAGNOSIS — O99212 Obesity complicating pregnancy, second trimester: Secondary | ICD-10-CM | POA: Diagnosis not present

## 2022-12-18 DIAGNOSIS — O0933 Supervision of pregnancy with insufficient antenatal care, third trimester: Secondary | ICD-10-CM | POA: Diagnosis not present

## 2022-12-18 DIAGNOSIS — O0932 Supervision of pregnancy with insufficient antenatal care, second trimester: Secondary | ICD-10-CM | POA: Diagnosis not present

## 2022-12-18 DIAGNOSIS — Z363 Encounter for antenatal screening for malformations: Secondary | ICD-10-CM | POA: Diagnosis not present

## 2022-12-18 DIAGNOSIS — Z3A20 20 weeks gestation of pregnancy: Secondary | ICD-10-CM | POA: Diagnosis not present

## 2022-12-18 DIAGNOSIS — O09522 Supervision of elderly multigravida, second trimester: Secondary | ICD-10-CM | POA: Diagnosis not present

## 2022-12-23 ENCOUNTER — Other Ambulatory Visit: Payer: Self-pay

## 2022-12-23 ENCOUNTER — Encounter (HOSPITAL_COMMUNITY): Payer: Self-pay | Admitting: *Deleted

## 2022-12-23 ENCOUNTER — Emergency Department (HOSPITAL_COMMUNITY)
Admission: EM | Admit: 2022-12-23 | Discharge: 2022-12-23 | Disposition: A | Discharge status: Home / Self Care | Payer: 59 | Attending: Emergency Medicine | Admitting: Emergency Medicine

## 2022-12-23 DIAGNOSIS — O26892 Other specified pregnancy related conditions, second trimester: Secondary | ICD-10-CM

## 2022-12-23 DIAGNOSIS — Z91040 Latex allergy status: Secondary | ICD-10-CM | POA: Insufficient documentation

## 2022-12-23 DIAGNOSIS — R8271 Bacteriuria: Secondary | ICD-10-CM | POA: Diagnosis not present

## 2022-12-23 DIAGNOSIS — B9689 Other specified bacterial agents as the cause of diseases classified elsewhere: Secondary | ICD-10-CM

## 2022-12-23 DIAGNOSIS — Z3A21 21 weeks gestation of pregnancy: Secondary | ICD-10-CM | POA: Diagnosis not present

## 2022-12-23 DIAGNOSIS — N760 Acute vaginitis: Secondary | ICD-10-CM | POA: Insufficient documentation

## 2022-12-23 DIAGNOSIS — O99891 Other specified diseases and conditions complicating pregnancy: Secondary | ICD-10-CM

## 2022-12-23 DIAGNOSIS — O23592 Infection of other part of genital tract in pregnancy, second trimester: Secondary | ICD-10-CM | POA: Diagnosis not present

## 2022-12-23 DIAGNOSIS — O2392 Unspecified genitourinary tract infection in pregnancy, second trimester: Secondary | ICD-10-CM | POA: Diagnosis not present

## 2022-12-23 HISTORY — DX: Anxiety disorder, unspecified: F41.9

## 2022-12-23 LAB — ABO/RH: ABO/RH(D): O POS

## 2022-12-23 LAB — CBC WITH DIFFERENTIAL/PLATELET
Abs Immature Granulocytes: 0.06 10*3/uL (ref 0.00–0.07)
Basophils Absolute: 0 10*3/uL (ref 0.0–0.1)
Basophils Relative: 0 %
Eosinophils Absolute: 0.1 10*3/uL (ref 0.0–0.5)
Eosinophils Relative: 1 %
HCT: 34.8 % — ABNORMAL LOW (ref 36.0–46.0)
Hemoglobin: 11.2 g/dL — ABNORMAL LOW (ref 12.0–15.0)
Immature Granulocytes: 1 %
Lymphocytes Relative: 11 %
Lymphs Abs: 1.2 10*3/uL (ref 0.7–4.0)
MCH: 25.9 pg — ABNORMAL LOW (ref 26.0–34.0)
MCHC: 32.2 g/dL (ref 30.0–36.0)
MCV: 80.4 fL (ref 80.0–100.0)
Monocytes Absolute: 0.6 10*3/uL (ref 0.1–1.0)
Monocytes Relative: 6 %
Neutro Abs: 8.6 10*3/uL — ABNORMAL HIGH (ref 1.7–7.7)
Neutrophils Relative %: 81 %
Platelets: 403 10*3/uL — ABNORMAL HIGH (ref 150–400)
RBC: 4.33 MIL/uL (ref 3.87–5.11)
RDW: 14.3 % (ref 11.5–15.5)
WBC: 10.5 10*3/uL (ref 4.0–10.5)
nRBC: 0 % (ref 0.0–0.2)

## 2022-12-23 LAB — COMPREHENSIVE METABOLIC PANEL
ALT: 30 U/L (ref 0–44)
AST: 21 U/L (ref 15–41)
Albumin: 3 g/dL — ABNORMAL LOW (ref 3.5–5.0)
Alkaline Phosphatase: 72 U/L (ref 38–126)
Anion gap: 9 (ref 5–15)
BUN: 7 mg/dL (ref 6–20)
CO2: 21 mmol/L — ABNORMAL LOW (ref 22–32)
Calcium: 8.7 mg/dL — ABNORMAL LOW (ref 8.9–10.3)
Chloride: 104 mmol/L (ref 98–111)
Creatinine, Ser: 0.43 mg/dL — ABNORMAL LOW (ref 0.44–1.00)
GFR, Estimated: >60 mL/min (ref >60)
Glucose, Bld: 110 mg/dL — ABNORMAL HIGH (ref 70–99)
Potassium: 3.6 mmol/L (ref 3.5–5.1)
Sodium: 134 mmol/L — ABNORMAL LOW (ref 135–145)
Total Bilirubin: 0.2 mg/dL (ref <1.2)
Total Protein: 6.9 g/dL (ref 6.5–8.1)

## 2022-12-23 LAB — URINALYSIS, ROUTINE W REFLEX MICROSCOPIC
Bilirubin Urine: NEGATIVE
Glucose, UA: NEGATIVE mg/dL
Hgb urine dipstick: NEGATIVE
Ketones, ur: NEGATIVE mg/dL
Nitrite: NEGATIVE
Protein, ur: NEGATIVE mg/dL
Specific Gravity, Urine: 1.017 (ref 1.005–1.030)
pH: 7 (ref 5.0–8.0)

## 2022-12-23 LAB — WET PREP, GENITAL
Sperm: NONE SEEN
Trich, Wet Prep: NONE SEEN
Yeast Wet Prep HPF POC: NONE SEEN

## 2022-12-23 LAB — HIV ANTIBODY (ROUTINE TESTING W REFLEX): HIV Screen 4th Generation wRfx: NONREACTIVE

## 2022-12-23 MED ORDER — SODIUM CHLORIDE 0.9 % IV BOLUS
1000.0000 mL | Freq: Once | INTRAVENOUS | Status: AC
  Administered 2022-12-23: 1000 mL via INTRAVENOUS

## 2022-12-23 MED ORDER — DOXYLAMINE SUCCINATE (SLEEP) 25 MG PO TABS: 12.5000 mg | ORAL_TABLET | Freq: Four times a day (QID) | ORAL | 0 refills | Status: AC

## 2022-12-23 MED ORDER — METRONIDAZOLE 500 MG PO TABS: 500.0000 mg | ORAL_TABLET | Freq: Two times a day (BID) | ORAL | 0 refills | Status: DC

## 2022-12-23 MED ORDER — DOXYLAMINE SUCCINATE (SLEEP) 25 MG PO TABS
25.0000 mg | ORAL_TABLET | ORAL | Status: DC
  Filled 2022-12-23: qty 1

## 2022-12-23 MED ORDER — PYRIDOXINE HCL 25 MG PO TABS
25.0000 mg | ORAL_TABLET | ORAL | Status: DC
  Filled 2022-12-23: qty 1

## 2022-12-23 MED ORDER — CEFDINIR 300 MG PO CAPS: 300.0000 mg | ORAL_CAPSULE | Freq: Two times a day (BID) | ORAL | 0 refills | Status: AC
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
## 2022-12-23 NOTE — Discharge Instructions (Signed)
You were seen for nausea, vomiting, diarrhea, and vaginal discharge in the emergency department.   At home, please take the Flagyl for your bacterial vaginosis and vaginal discharge.  Take the doxylamine for any nausea or vomiting.  And take the [facility] that we have prescribed you for the bacteria in your urine.    Check your MyChart online for the results of any tests that had not resulted by the time you left the emergency department.   Follow-up with your OB/GYN doctor in 2-3 days regarding your visit.    Return immediately to the emergency department if you experience any of the following: Worsening pain, vomiting despite the medication, vaginal bleeding or loss of fluids, contractions, or any other concerning symptoms.    Thank you for visiting our Emergency Department. It was a pleasure taking care of you today.

## 2022-12-23 NOTE — ED Provider Notes (Signed)
[place] EMERGENCY DEPARTMENT AT [facility] Provider Note   CSN: [id number] Arrival date & time: 12/23/22  7829     History {Add pertinent medical, surgical, social history, OB history to HPI:1} Chief Complaint  Patient presents with   Abdominal Cramping    Pt pregnant    Amanda Parsons is a 37 y.o. female.  37 year old female G1, P0 at 21 weeks and 2 days with a history of fibromyalgia, depression, PTSD, and anxiety who presents emergency department with abdominal cramping.  Since Friday patient has had lower abdominal cramping, nausea, vomiting, diarrhea.  Says that the abdominal cramping will come on for approximately 2 hours at a time and then go away.  Says that its either in her left lower quadrant or right lower quadrant but typically alternates.  Does not necessarily feel like contractions.  No loss of fluid or vaginal bleeding.  Says that she does have some milky white discharge.  Thinks she may have potentially been exposed to an STI and is requesting testing at this time.  No fevers.  Has had 3 episodes of nonbloody nonbilious emesis and some diarrhea this weekend.  No blood in her diarrhea.  No abdominal surgeries.  Has a history of constipation and started eating different foods just before this started to try and relieve her constipation.       Home Medications Prior to Admission medications   Medication Sig Start Date End Date Taking? Authorizing Provider  albuterol (VENTOLIN HFA) 108 (90 Base) MCG/ACT inhaler Inhale 1 puff into the lungs every 6 (six) hours as needed for wheezing or shortness of breath. 01/14/20   [provider]  cetirizine (ZYRTEC) 10 MG tablet Take 10 mg by mouth daily.    [provider]  ferrous sulfate 325 (65 FE) MG tablet Take 1 tablet by mouth daily.    [provider]  hydrOXYzine (ATARAX/VISTARIL) 25 MG tablet Take 25 mg by mouth every 8 (eight) hours as needed for anxiety or itching. 11/02/19    [provider]  omeprazole (PRILOSEC) 20 MG capsule Take 1 capsule by mouth daily. 03/27/20   [provider]  ondansetron (ZOFRAN-ODT) 4 MG disintegrating tablet Take 4 mg by mouth every 8 (eight) hours as needed for vomiting or nausea.    [provider]  Probiotic Product (MISC INTESTINAL FLORA REGULAT) CAPS Take 1 tablet by mouth daily.    [provider]  sertraline (ZOLOFT) 25 MG tablet Take 25 mg by mouth daily.    [provider]  Tart Cherry 1200 MG CAPS Take 1 capsule by mouth 2 (two) times a week. Drinks in juice form    [provider]      Allergies    Chia oil (salvia hispanica), Citrullus vulgaris, Ibuprofen, Latex, Nitrofurantoin, Nsaids, Other, Pineapple, Plum pulp, Pork-derived products, Amoxicillin, Cat hair extract, Duloxetine, Levonorgestrel-ethinyl estrad, Metoclopramide, Pregabalin, and Tramadol    Review of Systems   Review of Systems  Physical Exam Updated Vital Signs BP 111/73 (BP Location: Right Arm)   Pulse (!) 114   Temp 98.8 F (37.1 C)   Resp 18   Ht 5\' 3"  (1.6 m)   Wt 91.6 kg   LMP 07/27/2022   SpO2 97%   BMI 35.78 kg/m  Physical Exam Vitals and nursing note reviewed.  Constitutional:      General: She is not in acute distress.    Appearance: She is well-developed.  HENT:     Head: Normocephalic and atraumatic.  Right Ear: External ear normal.     Left Ear: External ear normal.     Nose: Nose normal.  Eyes:     Extraocular Movements: Extraocular movements intact.     Conjunctiva/sclera: Conjunctivae normal.     Pupils: Pupils are equal, round, and reactive to light.  Pulmonary:     Effort: Pulmonary effort is normal. No respiratory distress.  Abdominal:     General: There is distension.     Palpations: Abdomen is soft. There is no mass.     Tenderness: There is no abdominal tenderness. There is no guarding.     Comments: Gravid uterus  Musculoskeletal:     Cervical back: Normal  range of motion and neck supple.     Right lower leg: No edema.     Left lower leg: No edema.  Skin:    General: Skin is warm and dry.  Neurological:     Mental Status: She is alert and oriented to person, place, and time. Mental status is at baseline.  Psychiatric:        Mood and Affect: Mood normal.     ED Results / Procedures / Treatments   Labs (all labs ordered are listed, but only abnormal results are displayed) Labs Reviewed  WET PREP, GENITAL  PREGNANCY, URINE  URINALYSIS, ROUTINE W REFLEX MICROSCOPIC  CBC WITH DIFFERENTIAL/PLATELET  COMPREHENSIVE METABOLIC PANEL  HIV ANTIBODY (ROUTINE TESTING W REFLEX)  RPR  ABO/RH  GC/CHLAMYDIA PROBE AMP ([place]) NOT AT [facility]    EKG None  Radiology No results found.  Procedures Pelvic exam  Date/Time: 12/23/2022 11:23 AM  Performed by: Rondel Baton, MD Authorized by: Rondel Baton, MD  Consent: Verbal consent obtained. Required items: required blood products, implants, devices, and special equipment available Local anesthesia used: no  Anesthesia: Local anesthesia used: no  Sedation: Patient sedated: no  Patient tolerance: patient tolerated the procedure well with no immediate complications Comments: Chaperoned by RN Deanna.  External genitalia unremarkable. Nor rashes or lesions noted.  Speculum exam with normal appearing whitish vaginal discharge.  Vaginal wall mucosa is unremarkable.  Cervix visualized and is unremarkable (closed in appearance without any protruding material).  Bimanual exam without cervical motion tenderness, adnexal tenderness or any masses appreciated. Cervix closed.       {Document cardiac monitor, telemetry assessment procedure when appropriate:1}  Medications Ordered in ED Medications  sodium chloride 0.9 % bolus 1,000 mL (has no administration in time range)    ED Course/ Medical Decision Making/ A&P   {   Click here for ABCD2, HEART and other  calculatorsREFRESH Note before signing :1}                              Medical Decision Making Amount and/or Complexity of Data Reviewed Labs: ordered.  Risk OTC drugs.   ***  {Document critical care time when appropriate:1} {Document review of labs and clinical decision tools ie heart score, Chads2Vasc2 etc:1}  {Document your independent review of radiology images, and any outside records:1} {Document your discussion with family members, caretakers, and with consultants:1} {Document social determinants of health affecting pt's care:1} {Document your decision making why or why not admission, treatments were needed:1} Final Clinical Impression(s) / ED Diagnoses Final diagnoses:  None    Rx / DC Orders ED Discharge Orders     None

## 2022-12-23 NOTE — ED Notes (Signed)
Pt ate the crackers and drank water, pt now requesting a meal tray. Md notified

## 2022-12-23 NOTE — ED Triage Notes (Signed)
Pt c/o vomiting, diarrhea, and abdominal cramping since Friday. Pt is [redacted] weeks pregnant. Pt reports she hasn't seen her OB doctor because she didn't find out she was pregnant until she was 16 weeks. Her OB doctor is from [facility].

## 2022-12-23 NOTE — ED Notes (Signed)
Pt in bed, pt denies nausea, pt requests food, md notified, crackers and water given for po challenge.

## 2022-12-24 ENCOUNTER — Ambulatory Visit: Payer: 59 | Admitting: Cardiology

## 2022-12-24 LAB — GC/CHLAMYDIA PROBE AMP (~~LOC~~) NOT AT ARMC
Chlamydia: POSITIVE — AB
Comment: NEGATIVE
Comment: NORMAL
Neisseria Gonorrhea: NEGATIVE

## 2022-12-24 LAB — RPR: RPR Ser Ql: NONREACTIVE

## 2022-12-25 ENCOUNTER — Telehealth (HOSPITAL_COMMUNITY): Payer: Self-pay

## 2022-12-25 LAB — PREGNANCY, URINE: Preg Test, Ur: POSITIVE — AB

## 2022-12-25 MED ORDER — AZITHROMYCIN 500 MG PO TABS: 1000.0000 mg | ORAL_TABLET | Freq: Once | ORAL | 0 refills | Status: AC

## 2023-01-04 DIAGNOSIS — N939 Abnormal uterine and vaginal bleeding, unspecified: Secondary | ICD-10-CM | POA: Diagnosis not present

## 2023-01-04 DIAGNOSIS — O4690 Antepartum hemorrhage, unspecified, unspecified trimester: Secondary | ICD-10-CM | POA: Diagnosis not present

## 2023-01-04 DIAGNOSIS — O209 Hemorrhage in early pregnancy, unspecified: Secondary | ICD-10-CM | POA: Diagnosis not present

## 2023-01-04 DIAGNOSIS — Z87891 Personal history of nicotine dependence: Secondary | ICD-10-CM | POA: Diagnosis not present

## 2023-01-04 DIAGNOSIS — O4692 Antepartum hemorrhage, unspecified, second trimester: Secondary | ICD-10-CM | POA: Diagnosis not present

## 2023-01-04 DIAGNOSIS — Z3A22 22 weeks gestation of pregnancy: Secondary | ICD-10-CM | POA: Diagnosis not present

## 2023-01-15 DIAGNOSIS — J00 Acute nasopharyngitis [common cold]: Secondary | ICD-10-CM | POA: Diagnosis not present

## 2023-01-15 DIAGNOSIS — Z20822 Contact with and (suspected) exposure to covid-19: Secondary | ICD-10-CM | POA: Diagnosis not present

## 2023-01-15 DIAGNOSIS — R509 Fever, unspecified: Secondary | ICD-10-CM | POA: Diagnosis not present

## 2023-02-13 ENCOUNTER — Inpatient Hospital Stay (HOSPITAL_COMMUNITY)
Admission: EM | Admit: 2023-02-13 | Discharge: 2023-02-14 | Disposition: A | Discharge status: Home / Self Care | Payer: 59 | Attending: Obstetrics and Gynecology | Admitting: Obstetrics and Gynecology

## 2023-02-13 ENCOUNTER — Encounter (HOSPITAL_COMMUNITY): Payer: Self-pay

## 2023-02-13 ENCOUNTER — Other Ambulatory Visit: Payer: Self-pay

## 2023-02-13 DIAGNOSIS — O99353 Diseases of the nervous system complicating pregnancy, third trimester: Secondary | ICD-10-CM | POA: Insufficient documentation

## 2023-02-13 DIAGNOSIS — O99013 Anemia complicating pregnancy, third trimester: Secondary | ICD-10-CM | POA: Insufficient documentation

## 2023-02-13 DIAGNOSIS — O26893 Other specified pregnancy related conditions, third trimester: Secondary | ICD-10-CM | POA: Diagnosis present

## 2023-02-13 DIAGNOSIS — O99343 Other mental disorders complicating pregnancy, third trimester: Secondary | ICD-10-CM | POA: Diagnosis not present

## 2023-02-13 DIAGNOSIS — R102 Pelvic and perineal pain: Secondary | ICD-10-CM | POA: Insufficient documentation

## 2023-02-13 DIAGNOSIS — M797 Fibromyalgia: Secondary | ICD-10-CM | POA: Insufficient documentation

## 2023-02-13 DIAGNOSIS — F39 Unspecified mood [affective] disorder: Secondary | ICD-10-CM | POA: Diagnosis not present

## 2023-02-13 DIAGNOSIS — O09513 Supervision of elderly primigravida, third trimester: Secondary | ICD-10-CM | POA: Insufficient documentation

## 2023-02-13 DIAGNOSIS — O26899 Other specified pregnancy related conditions, unspecified trimester: Secondary | ICD-10-CM

## 2023-02-13 DIAGNOSIS — Z3A29 29 weeks gestation of pregnancy: Secondary | ICD-10-CM | POA: Diagnosis not present

## 2023-02-13 DIAGNOSIS — Z3689 Encounter for other specified antenatal screening: Secondary | ICD-10-CM | POA: Diagnosis not present

## 2023-02-13 LAB — COMPREHENSIVE METABOLIC PANEL
ALT: 19 U/L (ref 0–44)
AST: 16 U/L (ref 15–41)
Albumin: 2.9 g/dL — ABNORMAL LOW (ref 3.5–5.0)
Alkaline Phosphatase: 84 U/L (ref 38–126)
Anion gap: 7 (ref 5–15)
BUN: 11 mg/dL (ref 6–20)
CO2: 21 mmol/L — ABNORMAL LOW (ref 22–32)
Calcium: 9.1 mg/dL (ref 8.9–10.3)
Chloride: 108 mmol/L (ref 98–111)
Creatinine, Ser: 0.38 mg/dL — ABNORMAL LOW (ref 0.44–1.00)
GFR, Estimated: >60 mL/min (ref >60)
Glucose, Bld: 90 mg/dL (ref 70–99)
Potassium: 3.5 mmol/L (ref 3.5–5.1)
Sodium: 136 mmol/L (ref 135–145)
Total Bilirubin: 0.2 mg/dL (ref 0.0–1.2)
Total Protein: 7.1 g/dL (ref 6.5–8.1)

## 2023-02-13 LAB — CBC WITH DIFFERENTIAL/PLATELET
Abs Immature Granulocytes: 0.06 10*3/uL (ref 0.00–0.07)
Basophils Absolute: 0 10*3/uL (ref 0.0–0.1)
Basophils Relative: 0 %
Eosinophils Absolute: 0.1 10*3/uL (ref 0.0–0.5)
Eosinophils Relative: 1 %
HCT: 34.7 % — ABNORMAL LOW (ref 36.0–46.0)
Hemoglobin: 11.2 g/dL — ABNORMAL LOW (ref 12.0–15.0)
Immature Granulocytes: 1 %
Lymphocytes Relative: 18 %
Lymphs Abs: 2 10*3/uL (ref 0.7–4.0)
MCH: 25.2 pg — ABNORMAL LOW (ref 26.0–34.0)
MCHC: 32.3 g/dL (ref 30.0–36.0)
MCV: 78.2 fL — ABNORMAL LOW (ref 80.0–100.0)
Monocytes Absolute: 0.9 10*3/uL (ref 0.1–1.0)
Monocytes Relative: 9 %
Neutro Abs: 7.7 10*3/uL (ref 1.7–7.7)
Neutrophils Relative %: 71 %
Platelets: 431 10*3/uL — ABNORMAL HIGH (ref 150–400)
RBC: 4.44 MIL/uL (ref 3.87–5.11)
RDW: 15.2 % (ref 11.5–15.5)
WBC: 10.8 10*3/uL — ABNORMAL HIGH (ref 4.0–10.5)
nRBC: 0 % (ref 0.0–0.2)

## 2023-02-13 LAB — WET PREP, GENITAL
Clue Cells Wet Prep HPF POC: NONE SEEN
Sperm: NONE SEEN
Trich, Wet Prep: NONE SEEN
WBC, Wet Prep HPF POC: <10 (ref <10)
Yeast Wet Prep HPF POC: NONE SEEN

## 2023-02-13 LAB — URINALYSIS, ROUTINE W REFLEX MICROSCOPIC
Bilirubin Urine: NEGATIVE
Bilirubin Urine: NEGATIVE
Glucose, UA: 50 mg/dL — AB
Glucose, UA: NEGATIVE mg/dL
Hgb urine dipstick: NEGATIVE
Hgb urine dipstick: NEGATIVE
Ketones, ur: NEGATIVE mg/dL
Ketones, ur: NEGATIVE mg/dL
Leukocytes,Ua: NEGATIVE
Leukocytes,Ua: NEGATIVE
Nitrite: NEGATIVE
Nitrite: NEGATIVE
Protein, ur: 30 mg/dL — AB
Protein, ur: NEGATIVE mg/dL
Specific Gravity, Urine: 1.026 (ref 1.005–1.030)
Specific Gravity, Urine: 1.02 (ref 1.005–1.030)
pH: 5 (ref 5.0–8.0)
pH: 6 (ref 5.0–8.0)

## 2023-02-13 MED ORDER — CYCLOBENZAPRINE HCL 5 MG PO TABS
5.0000 mg | ORAL_TABLET | Freq: Once | ORAL | Status: AC
Start: 2023-02-13 — End: 2023-02-13
  Administered 2023-02-13: 5 mg via ORAL
  Filled 2023-02-13: qty 1

## 2023-02-13 NOTE — ED Provider Notes (Signed)
 [place] EMERGENCY DEPARTMENT AT [facility] Provider Note   CSN: [id number] Arrival date & time: 02/13/23  1848     History  Chief Complaint  Patient presents with   Abdominal Pain    6 months pregnant    Amanda Parsons is a 38 y.o. female.   Abdominal Pain Associated symptoms: dysuria   Associated symptoms: no chest pain, no diarrhea, no nausea, no shortness of breath, no vaginal bleeding and no vomiting        Amanda Parsons is a 38 y.o. female G1, P0 approximately [redacted] weeks pregnant, prenatal care at [facility] in [place]  who presents to the Emergency Department requesting evaluation for possible contractions/labor.  Has been having routine prenatal care.  States she has been feeling regular pressure sensations to her suprapubic/vaginal area every 15 minutes for approximately 2 hours.  She also notes which she feels is leaking urine.  She notes cloudy urine and history of frequent UTIs.  Increased urinary frequency.  denies any gush of fluid or vaginal bleeding.  States that she became upset earlier today with the baby's father and has been under increased stress recently continuing to work long hours at her job.  She denies any chest pain, shortness of breath.  Patient here earlier, became upset and left but shortly returned and requested evaluation with another provider.    Home Medications Prior to Admission medications   Medication Sig Start Date End Date Taking? Authorizing Provider  albuterol (VENTOLIN HFA) 108 (90 Base) MCG/ACT inhaler Inhale 1 puff into the lungs every 6 (six) hours as needed for wheezing or shortness of breath. 01/14/20   [provider]  cetirizine (ZYRTEC) 10 MG tablet Take 10 mg by mouth daily.    [provider]  doxylamine , Sleep, (UNISOM ) 25 MG tablet Take 0.5 tablets (12.5 mg total) by mouth every 6 (six) hours. PRN for nausea and vomiting. 12/23/22   Yolande Lamar BROCKS, MD  ferrous sulfate 325 (65 FE)  MG tablet Take 1 tablet by mouth daily.    [provider]  hydrOXYzine (ATARAX/VISTARIL) 25 MG tablet Take 25 mg by mouth every 8 (eight) hours as needed for anxiety or itching. 11/02/19   [provider]  metroNIDAZOLE  (FLAGYL ) 500 MG tablet Take 1 tablet (500 mg total) by mouth 2 (two) times daily. 12/23/22   Yolande Lamar BROCKS, MD  omeprazole (PRILOSEC) 20 MG capsule Take 1 capsule by mouth daily. 03/27/20   [provider]  ondansetron  (ZOFRAN -ODT) 4 MG disintegrating tablet Take 4 mg by mouth every 8 (eight) hours as needed for vomiting or nausea.    [provider]  Probiotic Product (MISC INTESTINAL FLORA REGULAT) CAPS Take 1 tablet by mouth daily.    [provider]  sertraline (ZOLOFT) 25 MG tablet Take 25 mg by mouth daily.    [provider]  Tart Cherry 1200 MG CAPS Take 1 capsule by mouth 2 (two) times a week. Drinks in juice form    [provider]      Allergies    Chia oil (salvia hispanica), Citrullus vulgaris, Ibuprofen, Latex, Nitrofurantoin, Nsaids, Other, Pineapple, Plum pulp, Pork-derived products, Amoxicillin , Cat hair extract, Duloxetine, Levonorgestrel-ethinyl estrad, Metoclopramide, Pregabalin, and Tramadol    Review of Systems   Review of Systems  Respiratory:  Negative for shortness of breath.   Cardiovascular:  Negative for chest pain.  Gastrointestinal:  Positive for abdominal pain. Negative for diarrhea, nausea and vomiting.  Genitourinary:  Positive for dysuria,  frequency, pelvic pain and vaginal pain. Negative for vaginal bleeding.    Physical Exam Updated Vital Signs BP 132/83   Pulse (!) 127   Temp 98.3 F (36.8 C) (Oral)   Resp 20   Ht 5' 3 (1.6 m)   Wt 93 kg   LMP 07/27/2022   SpO2 98%   BMI 36.31 kg/m  Physical Exam Vitals and nursing note reviewed. Exam conducted with a chaperone present.  Constitutional:      General: She is not in acute distress.    Appearance: She is  well-developed. She is not ill-appearing or toxic-appearing.  Cardiovascular:     Rate and Rhythm: Regular rhythm. Tachycardia present.     Pulses: Normal pulses.  Pulmonary:     Effort: Pulmonary effort is normal.  Abdominal:     Tenderness: There is no abdominal tenderness.     Comments: Abdomen is gravid  Genitourinary:    Comments: Speculum exam performed at request of MAU nurse after fetal monitoring. Cervix minimally dilated at approximately 4 mm.  No fluid in the vaginal vault, no bleeding appears to be mucous plug in the os Musculoskeletal:     Right lower leg: No edema.     Left lower leg: No edema.  Skin:    General: Skin is warm.     Capillary Refill: Capillary refill takes less than 2 seconds.  Neurological:     General: No focal deficit present.     Mental Status: She is alert.     Sensory: No sensory deficit.     Motor: No weakness.     ED Results / Procedures / Treatments   Labs (all labs ordered are listed, but only abnormal results are displayed) Labs Reviewed - No data to display  EKG None  Radiology No results found.  Procedures Procedures    Medications Ordered in ED Medications - No data to display  ED Course/ Medical Decision Making/ A&P                                 Medical Decision Making G1 P0 29-week pregnant female here for evaluation of pelvic/vaginal pain that began approximately 2 hours prior to arrival.  States she has been experiencing regular pressure type sensations of her vagina every 15 minutes.  Denies any gush of blood or fluid.  Notes having dark-colored urine and some intermittent leaking of what she feels is urine for several days.  History of frequent UTIs per patient.  Has had routine prenatal care at [facility] in [facility] .  Patient initially left AMA but shortly returned, she was placed on fetal monitoring upon her return.  Patient does not appear to be in distress or active labor on my exam.  Differential  includes early labor versus active labor, UTI  Darol Irving  Amount and/or Complexity of Data Reviewed Labs: ordered.    Details: Lab results pending Discussion of management or test interpretation with external provider(s): Fetal monitoring [facility].  Per [facility]U nursing, patient to be [facility], Dr. Jerilynn Buddle accepting physician  Risk Decision regarding hospitalization.           Final Clinical Impression(s) / ED Diagnoses Final diagnoses:  [redacted] weeks gestation of pregnancy    Rx / DC Orders ED Discharge Orders     None         Herlinda Milling, PA-C 02/13/23 2048  Zackowski, Scott, MD 02/14/23 2340

## 2023-02-13 NOTE — ED Notes (Signed)
 ED Provider at bedside. [name]

## 2023-02-13 NOTE — MAU Note (Signed)
.  Amanda Parsons is a 38 y.o. at [redacted]w[redacted]d here in MAU reporting: here from [facility] - reports cramping in lower abdomen that started around 1730. Denies VB or LOF. +FM. Reports urgency to urinate I feel like I have to pee pretty often and I can't empty my bladder completely. Also reporting HA and seeing white lines in her vision. Denies taking any medications for pain.   Onset of complaint: 1730 Pain score: 8 - abdomen; 8 - HA Vitals:   02/13/23 2100 02/13/23 2240  BP: 128/85 121/84  Pulse: (!) 112 (!) 113  Resp: 18 19  Temp: 98.3 F (36.8 C) 99 F (37.2 C)  SpO2: 98% 99%     FHT:145 Lab orders placed from triage: none

## 2023-02-13 NOTE — ED Notes (Signed)
 This nurse to room to assess pt - no one in room

## 2023-02-13 NOTE — ED Notes (Signed)
 Triplett, PA at bedside for [facility] exam

## 2023-02-13 NOTE — ED Triage Notes (Signed)
 Pt presents to ED with c/o abd cramping, pt is 6 months pregnant, reports pressure with increased pressure in lower abd today and burning/pressure after urinating for week Pt denies vag bleeding or water breaking, reports small amount of clear to milky white discharge reported for last 4 days

## 2023-02-13 NOTE — ED Notes (Signed)
 Rosealee, RN with [facility] suggested Chlamydia test be added on to urine as pt has hx of same- this suggestion was related to [place], [facility] nurse who asked accepting OB, Dr Zina if he wanted to add this on, Marissa reported to this nurse that Dr Zina said he would keep in mind, but can take care of that when pt arrived to MAU, no need to order at this time as pt is a/w transfer to MAU

## 2023-02-13 NOTE — ED Provider Notes (Signed)
 [place] EMERGENCY DEPARTMENT AT [facility] Provider Note   CSN: [id number] Arrival date & time: 02/13/23  1848     History  No chief complaint on file.   Amanda Parsons is a 38 y.o. female.  Patient approximately [redacted] weeks pregnant.  Due date is in March 2025.  [facility].  Patient here with pressure feeling concerns about labor.  She is a gravida 1, para 0.  Patient appeared not to be in any significant distress.  Informed patient of her protocol and we have to get her involved in the [facility] system.  Patient got upset and wanted to leave recommended that she not leave the let us  evaluate her that we can definitely get her into the [facility] health system if she is in labor.  Patient would not listen wanted a different doctor patient informed that there is no other doctor.  Patient left AMA.  No formal exam or monitoring was done.       Home Medications Prior to Admission medications   Medication Sig Start Date End Date Taking? Authorizing Provider  albuterol (VENTOLIN HFA) 108 (90 Base) MCG/ACT inhaler Inhale 1 puff into the lungs every 6 (six) hours as needed for wheezing or shortness of breath. 01/14/20   [provider]  cetirizine (ZYRTEC) 10 MG tablet Take 10 mg by mouth daily.    [provider]  doxylamine , Sleep, (UNISOM ) 25 MG tablet Take 0.5 tablets (12.5 mg total) by mouth every 6 (six) hours. PRN for nausea and vomiting. 12/23/22   Yolande Lamar BROCKS, MD  ferrous sulfate 325 (65 FE) MG tablet Take 1 tablet by mouth daily.    [provider]  hydrOXYzine (ATARAX/VISTARIL) 25 MG tablet Take 25 mg by mouth every 8 (eight) hours as needed for anxiety or itching. 11/02/19   [provider]  metroNIDAZOLE  (FLAGYL ) 500 MG tablet Take 1 tablet (500 mg total) by mouth 2 (two) times daily. 12/23/22   Yolande Lamar BROCKS, MD  omeprazole (PRILOSEC) 20 MG capsule Take 1 capsule by mouth  daily. 03/27/20   [provider]  ondansetron  (ZOFRAN -ODT) 4 MG disintegrating tablet Take 4 mg by mouth every 8 (eight) hours as needed for vomiting or nausea.    [provider]  Probiotic Product (MISC INTESTINAL FLORA REGULAT) CAPS Take 1 tablet by mouth daily.    [provider]  sertraline (ZOLOFT) 25 MG tablet Take 25 mg by mouth daily.    [provider]  Tart Cherry 1200 MG CAPS Take 1 capsule by mouth 2 (two) times a week. Drinks in juice form    [provider]      Allergies    Chia oil (salvia hispanica), Citrullus vulgaris, Ibuprofen, Latex, Nitrofurantoin, Nsaids, Other, Pineapple, Plum pulp, Pork-derived products, Amoxicillin , Cat hair extract, Duloxetine, Levonorgestrel-ethinyl estrad, Metoclopramide, Pregabalin, and Tramadol    Review of Systems   Review of Systems  Constitutional:  Negative for chills and fever.  HENT:  Negative for ear pain and sore throat.   Eyes:  Negative for pain and visual disturbance.  Respiratory:  Negative for cough and shortness of breath.   Cardiovascular:  Negative for chest pain and palpitations.  Gastrointestinal:  Positive for abdominal pain. Negative for vomiting.  Genitourinary:  Negative for dysuria and hematuria.  Musculoskeletal:  Negative for arthralgias and back pain.  Skin:  Negative for color change and rash.  Neurological:  Negative for seizures and  syncope.  All other systems reviewed and are negative.   Physical Exam Updated Vital Signs LMP 07/27/2022  Physical Exam  ED Results / Procedures / Treatments   Labs (all labs ordered are listed, but only abnormal results are displayed) Labs Reviewed - No data to display  EKG None  Radiology No results found.  Procedures Procedures    Medications Ordered in ED Medications - No data to display  ED Course/ Medical Decision Making/ A&P                                 Medical Decision Making  Patient [redacted] weeks pregnant  with concerns for premature labor.  Patient refused any kind of evaluation and left AGAINST MEDICAL ADVICE. Final Clinical Impression(s) / ED Diagnoses Final diagnoses:  [redacted] weeks gestation of pregnancy    Rx / DC Orders ED Discharge Orders     None         Geraldene Hamilton, MD 02/13/23 [phone]

## 2023-02-13 NOTE — Progress Notes (Signed)
 Received call from [facility] for patient presenting  Pt presents to ED with c/o abd cramping, pt is 6 months pregnant, reports pressure with increased pressure in lower abd today and burning/pressure after urinating for week Pt denies vag bleeding or water breaking, reports small amount of clear to milky white discharge reported for last 4 days  Patient rates pain 6/10.   2001- OB Attending notified of patient and above report. Requests for fetal fibronectin, if not available, request for spec exam to evaluate dilatation. Goal is for patient to be transferred to MAU.  2020- received call from [facility], spec exam has been performed by provider. Exam is negative for blood or fluid, mucus plug is visualized and cervix is visualized as 4-3mm.  Caller was advised to arrange for transport for patient to come to MAU.   2023- OB Attending notified of above updated report.   2106- Per [facility] staff, transport ETA to [facility], 15 minutes    2136- patient was removed from monitor by [facility] staff for transport to MAU  Daved Hamilton, RN

## 2023-02-14 ENCOUNTER — Encounter: Payer: Self-pay | Admitting: Obstetrics and Gynecology

## 2023-02-14 DIAGNOSIS — O26893 Other specified pregnancy related conditions, third trimester: Secondary | ICD-10-CM

## 2023-02-14 DIAGNOSIS — R8271 Bacteriuria: Secondary | ICD-10-CM | POA: Insufficient documentation

## 2023-02-14 DIAGNOSIS — R102 Pelvic and perineal pain: Secondary | ICD-10-CM

## 2023-02-14 DIAGNOSIS — Z3A29 29 weeks gestation of pregnancy: Secondary | ICD-10-CM

## 2023-02-14 LAB — GC/CHLAMYDIA PROBE AMP (~~LOC~~) NOT AT ARMC
Chlamydia: NEGATIVE
Comment: NEGATIVE
Comment: NORMAL
Neisseria Gonorrhea: NEGATIVE

## 2023-02-14 LAB — CULTURE, OB URINE: Culture: <10000 — AB

## 2023-02-14 MED ORDER — CYCLOBENZAPRINE HCL 5 MG PO TABS: 5.0000 mg | ORAL_TABLET | Freq: Three times a day (TID) | ORAL | 1 refills | Status: DC | PRN

## 2023-02-14 NOTE — MAU Provider Note (Addendum)
 MAU Provider Note  Chief Complaint: Abdominal Pain (6 months pregnant)   Event Date/Time   First Provider Initiated Contact with Patient 02/13/23 2249      SUBJECTIVE HPI: Amanda Parsons is a 38 y.o. G1P0 at [redacted]w[redacted]d by LMP who presents to maternity admissions reporting cramping with concern for contractions. Pregnancy c/b anemia, fibromyalgia, mood disorder. Receives [facility] with [facility].  Patient notes feeling lower abdominal cramps that were painful and coming a few minutes apart earlier in the afternoon. She initially went to be seen at [facility] and was transferred to MAU to rule-out preterm labor and further assessment. At OSH, obtained CBC, CMP, UA. SSE showed ~0.5 cm dilated cervix. Deceleration noted on fetal monitoring prior to transfer.  Upon arrival, patient states she no longer is feeling the cramping. She does note urinating more than usual. She denies pelvic pressure, DFM, VB, LOF, change in vaginal discharge.  HPI  Past Medical History:  Diagnosis Date   Anemia    Anxiety    Bipolar and related disorder (HCC)    Borderline personality disorder (HCC)    COVID 01/2020   Depression    Fibromyalgia    PTSD (post-traumatic stress disorder)    Thyroid  disease during pregnancy    Past Surgical History:  Procedure Laterality Date   sinus cavity surgery     Social History   Socioeconomic History   Marital status: Single    Spouse name: Not on file   Number of children: Not on file   Years of education: Not on file   Highest education level: Not on file  Occupational History   Not on file  Tobacco Use   Smoking status: Never   Smokeless tobacco: Never  Vaping Use   Vaping status: Never Used  Substance and Sexual Activity   Alcohol use: Not Currently   Drug use: Never   Sexual activity: Yes  Other Topics Concern   Not on file  Social History Narrative   Not on file   Social Drivers of Health   Financial Resource Strain: High Risk  (08/31/2021)   [facility] prior to [facility]/13/2022., [facility] visits prior to 04/13/2022.   Overall Financial Resource Strain (CARDIA)    Difficulty of Paying Living Expenses: Very hard  Food Insecurity: High Risk (06/26/2022)   [facility]   Hunger Vital Sign    Worried About Running Out of Food in the Last Year: Often true    Ran Out of Food in the Last Year: Often true  Transportation Needs: No Transportation Needs (06/26/2022)   [facility]   Transportation    In the past 12 months, has lack of reliable transportation kept you from medical appointments, meetings, work or from getting things needed for daily living? : No  Physical Activity: Sufficiently Active (08/31/2021)   Received from [facility], [facility] prior to 04/13/2022., [facility] visits prior to 04/13/2022.   Exercise Vital Sign    Days of Exercise per Week: 4 days    Minutes of Exercise per Session: 60 min  Stress: Stress Concern Present (08/31/2021)   [facility] from [facility], [facility] visits prior to 04/13/2022., [facility] visits prior to 04/13/2022.   [employer] of Occupational Health - Occupational Stress Questionnaire  Feeling of Stress : Very much  Social Connections: Socially Isolated (08/31/2021)   [facility], [facility] prior to 04/13/2022., [facility] visits prior to 04/13/2022.   Social Advertising Account Executive [NHANES]    Frequency of Communication with Friends and Family: More than three times a week    Frequency of Social Gatherings with Friends and Family: Twice a week    Attends Religious Services: Never    Database Administrator or  Organizations: No    Attends Banker Meetings: Never    Marital Status: Never married  Intimate Partner Violence: Not At Risk (11/10/2022)   Received from [facility]   HITS    Over the last 12 months how often did your partner physically hurt you?: Never    Over the last 12 months how often did your partner insult you or talk down to you?: Never    Over the last 12 months how often did your partner threaten you with physical harm?: Never    Over the last 12 months how often did your partner scream or curse at you?: Never   No current facility-administered medications on file prior to encounter.   Current Outpatient Medications on File Prior to Encounter  Medication Sig Dispense Refill   cetirizine (ZYRTEC) 10 MG tablet Take 10 mg by mouth daily.     omeprazole (PRILOSEC) 20 MG capsule Take 1 capsule by mouth daily.     Probiotic Product (MISC INTESTINAL FLORA REGULAT) CAPS Take 1 tablet by mouth daily.     sertraline (ZOLOFT) 25 MG tablet Take 25 mg by mouth daily.     albuterol (VENTOLIN HFA) 108 (90 Base) MCG/ACT inhaler Inhale 1 puff into the lungs every 6 (six) hours as needed for wheezing or shortness of breath.     doxylamine , Sleep, (UNISOM ) 25 MG tablet Take 0.5 tablets (12.5 mg total) by mouth every 6 (six) hours. PRN for nausea and vomiting. 15 tablet 0   ferrous sulfate 325 (65 FE) MG tablet Take 1 tablet by mouth daily.     hydrOXYzine (ATARAX/VISTARIL) 25 MG tablet Take 25 mg by mouth every 8 (eight) hours as needed for anxiety or itching.     metroNIDAZOLE  (FLAGYL ) 500 MG tablet Take 1 tablet (500 mg total) by mouth 2 (two) times daily. 14 tablet 0   ondansetron  (ZOFRAN -ODT) 4 MG disintegrating tablet Take 4 mg by mouth every 8 (eight) hours as needed for vomiting or nausea.     Tart Cherry 1200 MG CAPS Take 1 capsule by mouth 2 (two) times a week. Drinks in juice form     Allergies  Allergen Reactions   Chia Oil (Salvia Hispanica) Anaphylaxis   Citrullus  Vulgaris Anaphylaxis, Itching and Rash   Ibuprofen Swelling   Latex Itching, Rash and Swelling   Nitrofurantoin Anaphylaxis and Other (See Comments)   Nsaids Other (See Comments)    Can't remember reaction but pt states she ended up in the hospital and has happened more than once     Other Anaphylaxis, Other (See Comments) and Rash    Apples,   chili peppers -rash   Seizures  Apples,   chili peppers -rash   Apple   Pineapple Anaphylaxis, Itching and Rash   Plum Pulp Anaphylaxis   Pork-Derived Products Anaphylaxis, Hives, Itching and Rash   Amoxicillin  Other (See Comments)    Caused an ulcer in esophagus near her heart per  pt. Pt also stated she can not have high doses of antibiotics.    Caused an ulcer in esophagus near her heart per pt. Pt also stated she can not have high doses of antibiotics.       Cat Hair Extract Itching   Duloxetine Swelling   Levonorgestrel-Ethinyl Estrad Swelling   Metoclopramide Other (See Comments)    seizures seizures    Pregabalin Other (See Comments)    Can't remember Can't remember    Tramadol Other (See Comments)    seizure seizure     ROS:  Pertinent positives/negatives listed above.  I have reviewed patient's Past Medical Hx, Surgical Hx, Family Hx, Social Hx, medications and allergies.   Physical Exam  Patient Vitals for the past 24 hrs:  BP Temp Temp src Pulse Resp SpO2 Height Weight  02/13/23 2240 121/84 99 F (37.2 C) Oral (!) 113 19 99 % -- --  02/13/23 2100 128/85 98.3 F (36.8 C) -- (!) 112 18 98 % -- --  02/13/23 1935 132/83 98.3 F (36.8 C) Oral (!) 127 20 98 % 5' 3 (1.6 m) 93 kg   Constitutional: Well-developed, well-nourished female in no acute distress  Cardiovascular: normal rate Respiratory: normal effort GI: Abd soft, non-tender MS: Extremities nontender, no edema, normal ROM Neurologic: Alert and oriented x 4   Cervix: closed/thick/high. Normal external genitalia  FHT:  Baseline 140, moderate  variability, accelerations present, no decelerations Contractions: none  LAB RESULTS Results for orders placed or performed during the hospital encounter of 02/13/23 (from the past 24 hours)  Urinalysis, Routine w reflex microscopic -Urine, Clean Catch     Status: Abnormal   Collection Time: 02/13/23  7:30 PM  Result Value Ref Range   Color, Urine YELLOW YELLOW   APPearance CLOUDY (A) CLEAR   Specific Gravity, Urine 1.026 1.005 - 1.030   pH 5.0 5.0 - 8.0   Glucose, UA 50 (A) NEGATIVE mg/dL   Hgb urine dipstick NEGATIVE NEGATIVE   Bilirubin Urine NEGATIVE NEGATIVE   Ketones, ur NEGATIVE NEGATIVE mg/dL   Protein, ur 30 (A) NEGATIVE mg/dL   Nitrite NEGATIVE NEGATIVE   Leukocytes,Ua NEGATIVE NEGATIVE   RBC / HPF 0-5 0 - 5 RBC/hpf   WBC, UA 6-10 0 - 5 WBC/hpf   Bacteria, UA MANY (A) NONE SEEN   Squamous Epithelial / HPF 11-20 0 - 5 /HPF   Mucus PRESENT    Ca Oxalate Crys, UA PRESENT   CBC with Differential     Status: Abnormal   Collection Time: 02/13/23  9:18 PM  Result Value Ref Range   WBC 10.8 (H) 4.0 - 10.5 K/uL   RBC 4.44 3.87 - 5.11 MIL/uL   Hemoglobin 11.2 (L) 12.0 - 15.0 g/dL   HCT 65.2 (L) 63.9 - 53.9 %   MCV 78.2 (L) 80.0 - 100.0 fL   MCH 25.2 (L) 26.0 - 34.0 pg   MCHC 32.3 30.0 - 36.0 g/dL   RDW 84.7 88.4 - 84.4 %   Platelets 431 (H) 150 - 400 K/uL   nRBC 0.0 0.0 - 0.2 %   Neutrophils Relative % 71 %   Neutro Abs 7.7 1.7 - 7.7 K/uL   Lymphocytes Relative 18 %   Lymphs Abs 2.0 0.7 - 4.0 K/uL   Monocytes Relative 9 %   Monocytes Absolute 0.9 0.1 - 1.0 K/uL   Eosinophils Relative 1 %   Eosinophils Absolute 0.1 0.0 - 0.5 K/uL   Basophils Relative 0 %   Basophils  Absolute 0.0 0.0 - 0.1 K/uL   Immature Granulocytes 1 %   Abs Immature Granulocytes 0.06 0.00 - 0.07 K/uL  Comprehensive metabolic panel     Status: Abnormal   Collection Time: 02/13/23  9:18 PM  Result Value Ref Range   Sodium 136 135 - 145 mmol/L   Potassium 3.5 3.5 - 5.1 mmol/L   Chloride 108 98 -  111 mmol/L   CO2 21 (L) 22 - 32 mmol/L   Glucose, Bld 90 70 - 99 mg/dL   BUN 11 6 - 20 mg/dL   Creatinine, Ser 9.61 (L) 0.44 - 1.00 mg/dL   Calcium 9.1 8.9 - 89.6 mg/dL   Total Protein 7.1 6.5 - 8.1 g/dL   Albumin 2.9 (L) 3.5 - 5.0 g/dL   AST 16 15 - 41 U/L   ALT 19 0 - 44 U/L   Alkaline Phosphatase 84 38 - 126 U/L   Total Bilirubin 0.2 0.0 - 1.2 mg/dL   GFR, Estimated >39 >39 mL/min   Anion gap 7 5 - 15  Wet prep, genital     Status: None   Collection Time: 02/13/23 11:10 PM  Result Value Ref Range   Yeast Wet Prep HPF POC NONE SEEN NONE SEEN   Trich, Wet Prep NONE SEEN NONE SEEN   Clue Cells Wet Prep HPF POC NONE SEEN NONE SEEN   WBC, Wet Prep HPF POC <10 <10   Sperm NONE SEEN   Urinalysis, Routine w reflex microscopic -Urine, Clean Catch     Status: Abnormal   Collection Time: 02/13/23 11:13 PM  Result Value Ref Range   Color, Urine YELLOW YELLOW   APPearance HAZY (A) CLEAR   Specific Gravity, Urine 1.020 1.005 - 1.030   pH 6.0 5.0 - 8.0   Glucose, UA NEGATIVE NEGATIVE mg/dL   Hgb urine dipstick NEGATIVE NEGATIVE   Bilirubin Urine NEGATIVE NEGATIVE   Ketones, ur NEGATIVE NEGATIVE mg/dL   Protein, ur NEGATIVE NEGATIVE mg/dL   Nitrite NEGATIVE NEGATIVE   Leukocytes,Ua NEGATIVE NEGATIVE    --/--/O POS Performed at [facility], [address], [place], [place] [address]  (11/11 1027)  IMAGING No results found.  MAU Management/MDM: Orders Placed This Encounter  Procedures   Wet prep, genital   Culture, OB Urine   CBC with Differential   Comprehensive metabolic panel   Urinalysis, Routine w reflex microscopic -Urine, Clean Catch   Urinalysis, Routine w reflex microscopic -Urine, Clean Catch   Discharge patient    Meds ordered this encounter  Medications   cyclobenzaprine  (FLEXERIL ) tablet 5 mg   cyclobenzaprine  (FLEXERIL ) 5 MG tablet    Sig: Take 1-2 tablets (5-10 mg total) by mouth 3 (three) times daily as needed for muscle spasms.    Dispense:  60 tablet     Refill:  1     Available prenatal records reviewed.  Patient presented from [facility] to rule-out preterm labor. She is not contracting on toco and is no longer feeling the cramping she was earlier. Cervix reassuringly closed on exam. UTI, vaginitis, preterm labor ruled-out. Given flexeril  for pelvic discomfort which helped significantly on reassessment. Provided with flexeril  script for home.  FWB: Reactive tracing for 1+ hours while in MAU. Did have a short deceleration while at [facility], however tracing is excellent here and feels good FM. Overall reassuring fetal status at this time especially as she is [redacted] weeks gestation.  ASSESSMENT 1. Pelvic pain in pregnancy   2. [redacted] weeks gestation of  pregnancy   3. NST (non-stress test) reactive     PLAN Discharge home with strict return precautions. Allergies as of 02/14/2023       Reactions   Chia Oil (salvia Hispanica) Anaphylaxis   Citrullus Vulgaris Anaphylaxis, Itching, Rash   Ibuprofen Swelling   Latex Itching, Rash, Swelling   Nitrofurantoin Anaphylaxis, Other (See Comments)   Nsaids Other (See Comments)   Can't remember reaction but pt states she ended up in the hospital and has happened more than once     Other Anaphylaxis, Other (See Comments), Rash   Apples,   chili peppers -rash   Seizures Apples,   chili peppers -rash  Apple   Pineapple Anaphylaxis, Itching, Rash   Plum Pulp Anaphylaxis   Pork-derived Products Anaphylaxis, Hives, Itching, Rash   Amoxicillin  Other (See Comments)   Caused an ulcer in esophagus near her heart per pt. Pt also stated she can not have high doses of antibiotics.    Caused an ulcer in esophagus near her heart per pt. Pt also stated she can not have high doses of antibiotics.      Cat Hair Extract Itching   Duloxetine Swelling   Levonorgestrel-ethinyl Estrad Swelling   Metoclopramide Other (See Comments)   seizures seizures   Pregabalin Other (See Comments)   Can't remember Can't  remember   Tramadol Other (See Comments)   seizure seizure        Medication List     STOP taking these medications    metroNIDAZOLE  500 MG tablet Commonly known as: FLAGYL        TAKE these medications    albuterol 108 (90 Base) MCG/ACT inhaler Commonly known as: VENTOLIN HFA Inhale 1 puff into the lungs every 6 (six) hours as needed for wheezing or shortness of breath.   cetirizine 10 MG tablet Commonly known as: ZYRTEC Take 10 mg by mouth daily.   cyclobenzaprine  5 MG tablet Commonly known as: FLEXERIL  Take 1-2 tablets (5-10 mg total) by mouth 3 (three) times daily as needed for muscle spasms.   doxylamine  (Sleep) 25 MG tablet Commonly known as: UNISOM  Take 0.5 tablets (12.5 mg total) by mouth every 6 (six) hours. PRN for nausea and vomiting.   ferrous sulfate 325 (65 FE) MG tablet Take 1 tablet by mouth daily.   hydrOXYzine 25 MG tablet Commonly known as: ATARAX Take 25 mg by mouth every 8 (eight) hours as needed for anxiety or itching.   Misc Intestinal Flora Regulat Caps Take 1 tablet by mouth daily.   omeprazole 20 MG capsule Commonly known as: PRILOSEC Take 1 capsule by mouth daily.   ondansetron  4 MG disintegrating tablet Commonly known as: ZOFRAN -ODT Take 4 mg by mouth every 8 (eight) hours as needed for vomiting or nausea.   sertraline 25 MG tablet Commonly known as: ZOLOFT Take 25 mg by mouth daily.   Tart Cherry 1200 MG Caps Take 1 capsule by mouth 2 (two) times a week. Drinks in juice form         Almarie Moats, MD [facility] Fellow 02/14/2023  12:22 AM

## 2023-02-16 DIAGNOSIS — R8271 Bacteriuria: Secondary | ICD-10-CM

## 2023-02-16 MED ORDER — FAMOTIDINE 20 MG PO TABS: 20.0000 mg | ORAL_TABLET | Freq: Two times a day (BID) | ORAL | 0 refills | Status: AC

## 2023-02-16 MED ORDER — AMOXICILLIN 500 MG PO CAPS: 500.0000 mg | ORAL_CAPSULE | Freq: Three times a day (TID) | ORAL | 0 refills | Status: AC

## 2023-02-21 ENCOUNTER — Telehealth: Payer: Self-pay

## 2023-02-21 ENCOUNTER — Ambulatory Visit: Payer: 59 | Admitting: Cardiology

## 2023-02-21 NOTE — Telephone Encounter (Signed)
 Unable to reach the patient to r/s for next week. I LM on my chart requesting call back.

## 2024-02-22 ENCOUNTER — Ambulatory Visit (HOSPITAL_COMMUNITY)
Admission: EM | Admit: 2024-02-22 | Discharge: 2024-02-22 | Disposition: A | Discharge status: Home / Self Care | Payer: MEDICAID | Attending: Student | Admitting: Student

## 2024-02-22 DIAGNOSIS — R399 Unspecified symptoms and signs involving the genitourinary system: Secondary | ICD-10-CM | POA: Diagnosis not present

## 2024-02-22 DIAGNOSIS — S39012A Strain of muscle, fascia and tendon of lower back, initial encounter: Secondary | ICD-10-CM | POA: Diagnosis not present

## 2024-02-22 LAB — POCT URINE DIPSTICK
Bilirubin, UA: NEGATIVE
Blood, UA: NEGATIVE
Glucose, UA: NEGATIVE mg/dL
Ketones, POC UA: NEGATIVE mg/dL
Leukocytes, UA: NEGATIVE
Protein Ur, POC: NEGATIVE mg/dL
Spec Grav, UA: >=1.03 — AB
Urobilinogen, UA: 0.2 U/dL
pH, UA: 6

## 2024-02-22 LAB — POCT URINE PREGNANCY: Preg Test, Ur: NEGATIVE

## 2024-02-22 MED ORDER — CYCLOBENZAPRINE HCL 5 MG PO TABS: 5.0000 mg | ORAL_TABLET | Freq: Three times a day (TID) | ORAL | 1 refills | Status: AC | PRN

## 2024-02-22 NOTE — Discharge Instructions (Addendum)
-  Restart the flexeril  muscle relaxer.  -Follow-up with [facility]. They have a walk in clinic, or you can schedule an appointment -You do not have a UTI.

## 2024-02-22 NOTE — ED Triage Notes (Signed)
 Patient presents to the office for dysuria x 2 days. Patient reports she walk into smoke yesterday and her asthma flare-up.

## 2024-02-22 NOTE — ED Provider Notes (Signed)
 " [facility]    CSN: [id number] Arrival date & time: 02/22/24  1915      History   Chief Complaint Chief Complaint  Patient presents with   Cough   Nasal Congestion   Dysuria    HPI Amanda Parsons is a 39 y.o. female ROS diffusely positive. -States back pain x2 days.  -States urinary frequency, urgency.  -Denies constipation or saddle anesthesia. -States h/o DDD, herniated lumbar disc. H/o fibromyalgia.  Does not have a spine specialist.  -Denies trauma or overuse.  -Denies vaginal symptoms. -States she is concerned her impacted wisdom teeth are causing her back pain. -Negative home covid and influenza tests -Denies SOB, cough at time of visit; she has an albuterol inhaler on hand as needed. -Last tylenol was 1600  Accompanied by friend today.  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   Mood disorder 12/06/2021   Suicidal ideations 12/06/2021   Borderline personality disorder (HCC) 04/27/2020   DDD (degenerative disc disease), lumbar 08/30/2019   Anxiety and depression 07/28/2019   Anemia 07/28/2019   Elevated LFTs 07/28/2019   Fibromyalgia 07/28/2019   History of thyroid  disorder 07/28/2019   Prediabetes 07/28/2019   Tremors of nervous system 07/28/2019   Thrombocytosis 07/28/2019    Past Surgical History:  Procedure Laterality Date   sinus cavity surgery      OB History      Gravida  1   Para      Term      Preterm      AB      Living         SAB      IAB      Ectopic      Multiple      Live Births               Home Medications    Prior to Admission medications  Medication Sig Start Date End Date Taking? Authorizing Provider  albuterol (VENTOLIN HFA) 108 (90 Base) MCG/ACT inhaler Inhale 1 puff into the lungs every 6 (six) hours as needed for wheezing or shortness of breath. 01/14/20  Yes [provider]  cetirizine (ZYRTEC) 10 MG tablet Take 10 mg by mouth daily.   Yes [provider]  doxylamine , Sleep, (UNISOM ) 25 MG tablet Take 0.5 tablets (12.5 mg total) by mouth every 6 (six) hours. PRN for nausea and vomiting. 12/23/22  Yes Yolande Lamar BROCKS, MD  famotidine  (PEPCID ) 20 MG tablet Take 1 tablet (20 mg total) by mouth 2 (two) times daily. 02/16/23  Yes Kumar, Agnijita, MD  ferrous sulfate 325 (65 FE) MG tablet Take 1 tablet by mouth daily.   Yes [provider]  hydrOXYzine (ATARAX/VISTARIL) 25 MG tablet Take 25 mg by mouth every 8 (eight) hours as needed for anxiety or itching. 11/02/19  Yes [provider]  omeprazole (  PRILOSEC) 20 MG capsule Take 1 capsule by mouth daily. 03/27/20  Yes [provider]  ondansetron  (ZOFRAN -ODT) 4 MG disintegrating tablet Take 4 mg by mouth every 8 (eight) hours as needed for vomiting or nausea.   Yes [provider]  Probiotic Product (MISC INTESTINAL FLORA REGULAT) CAPS Take 1 tablet by mouth daily.   Yes [provider]  sertraline (ZOLOFT) 25 MG tablet Take 25 mg by mouth daily.   Yes [provider]  Tart Cherry 1200 MG CAPS Take 1 capsule by mouth 2 (two) times a week. Drinks in juice form   Yes [provider]  cyclobenzaprine  (FLEXERIL ) 5 MG tablet Take 1-2 tablets (5-10 mg total) by mouth 3 (three) times daily as needed for muscle spasms. 02/22/24   Arlyss Leita BRAVO, PA-C    Family History Family History  Problem  Relation Age of Onset   Heart attack Mother    Diabetes Mother    Hashimoto's thyroiditis Mother    Heart failure Mother    Multiple sclerosis Father    Diabetes Other     Social History Social History[1]   Allergies   Chia oil (salvia hispanica), Citrullus vulgaris, Ibuprofen, Latex, Nitrofurantoin, Nsaids, Other, Pineapple, Plum pulp, Porcine (pork) protein-containing drug products, Amoxicillin , Cat dander, Duloxetine, Levonorgestrel-ethinyl estrad, Metoclopramide, Pregabalin, and Tramadol   Review of Systems Review of Systems  Constitutional:  Negative for appetite change, chills and fever.  HENT:  Negative for congestion, ear pain, rhinorrhea, sinus pressure, sinus pain and sore throat.   Eyes:  Negative for redness and visual disturbance.  Respiratory:  Negative for cough, chest tightness, shortness of breath and wheezing.   Cardiovascular:  Negative for chest pain and palpitations.  Gastrointestinal:  Negative for abdominal pain, constipation, diarrhea, nausea and vomiting.  Genitourinary:  Positive for frequency. Negative for dysuria and urgency.  Musculoskeletal:  Positive for back pain. Negative for myalgias.  Neurological:  Negative for dizziness, weakness and headaches.  Psychiatric/Behavioral:  Negative for confusion.   All other systems reviewed and are negative.    Physical Exam Triage Vital Signs ED Triage Vitals [02/22/24 2007]  Encounter Vitals Group     BP 115/78     Girls Systolic BP Percentile      Girls Diastolic BP Percentile      Boys Systolic BP Percentile      Boys Diastolic BP Percentile      Pulse Rate (!) 116     Resp 18     Temp 98.5 F (36.9 C)     Temp Source Oral     SpO2 98 %     Weight      Height      Head Circumference      Peak Flow      Pain Score      Pain Loc      Pain Education      Exclude from Growth Chart    No data found.  Updated Vital Signs BP 115/78 (BP Location: Left Arm)   Pulse (!) 116   Temp 98.5 F  (36.9 C) (Oral)   Resp 18   LMP 02/15/2024   SpO2 98%   Visual Acuity Right Eye Distance:   Left Eye Distance:   Bilateral Distance:    Right Eye Near:   Left Eye Near:    Bilateral Near:     Physical Exam Vitals reviewed.  Constitutional:      General: She is not in acute distress.  Appearance: Normal appearance. She is not ill-appearing or diaphoretic.  HENT:     Head: Normocephalic and atraumatic.  Cardiovascular:     Rate and Rhythm: Regular rhythm. Tachycardia present.     Heart sounds: Normal heart sounds.  Pulmonary:     Effort: Pulmonary effort is normal.     Breath sounds: Normal breath sounds.  Abdominal:     Tenderness: There is no abdominal tenderness.     Comments: No CVAT  Musculoskeletal:     Comments: Exquisitely tender to palpation over the lumbar spine and bilateral lumbar paraspinous muscles.  Gait intact, but with pain.  No midline spinous tenderness, bony deformity palpated.  Sensation intact.  Skin:    General: Skin is warm.  Neurological:     General: No focal deficit present.     Mental Status: She is alert and oriented to person, place, and time.  Psychiatric:        Mood and Affect: Mood normal.        Behavior: Behavior normal.        Thought Content: Thought content normal.        Judgment: Judgment normal.      UC Treatments / Results  Labs (all labs ordered are listed, but only abnormal results are displayed) Labs Reviewed  POCT URINE DIPSTICK - Abnormal; Notable for the following components:      Result Value   Spec Grav, UA >=1.030 (*)    All other components within normal limits  POCT URINE PREGNANCY - Normal    EKG   Radiology No results found.  Procedures Procedures (including critical care time)  Medications Ordered in UC Medications - No data to display  Initial Impression / Assessment and Plan / UC Course  I have reviewed the triage vital signs and the nursing notes.  Pertinent labs & imaging results that  were available during my care of the patient were reviewed by me and considered in my medical decision making (see chart for details).     Patient is a pleasant 39 y.o. female presenting with acute exacerbation of chronic back pain. The patient is afebrile and nontachycardic.  Antipyretic has not been administered today. Tachycardia can be attributed to pain.  Urine pregnancy negative. UA with negative blood, negative leuk, negative nitrite.    Home covid influenza negative.  On exam, her lungs are clear to auscultation, and she is not wheezing.  She has an albuterol at home already if she needs it.  On exam, she does not have any midline spinous tenderness or red flag symptoms.  However, she has chronic back issues, and needs a spine doctor.  She will follow-up with [facility].  Flexeril  refilled.  I do not think that her impacted wisdom teeth are the cause of the above symptoms.  -Coding Level 4 for acute exacerbation of chronic condition and prescription drug management.   Final Clinical Impressions(s) / UC Diagnoses   Final diagnoses:  Urinary symptom or sign  Strain of lumbar region, initial encounter     Discharge Instructions      -Restart the flexeril  muscle relaxer.  -Follow-up with [facility]. They have a walk in clinic, or you can schedule an appointment -You do not have a UTI.     ED Prescriptions     Medication Sig Dispense Auth. Provider   cyclobenzaprine  (FLEXERIL ) 5 MG tablet Take 1-2 tablets (5-10 mg total) by mouth 3 (three) times daily as needed for muscle spasms. 60 tablet Jessup Ogas E,  PA-C      PDMP not reviewed this encounter.     [1]  Social History Tobacco Use   Smoking status: Never   Smokeless tobacco: Never  Vaping Use   Vaping status: Never Used  Substance Use Topics   Alcohol use: Not Currently   Drug use: Never     Arlyss Leita BRAVO, PA-C 02/23/24 1134  "

## 2024-03-23 ENCOUNTER — Ambulatory Visit: Payer: MEDICAID | Admitting: Physical Therapy
